# Patient Record
Sex: Male | Born: 1960
Health system: Southern US, Community
[De-identification: ages and names within clinical notes are randomized; demographics above are authoritative.]

## PROBLEM LIST (undated history)

## (undated) DIAGNOSIS — E7849 Other hyperlipidemia: Secondary | ICD-10-CM

## (undated) DIAGNOSIS — K219 Gastro-esophageal reflux disease without esophagitis: Secondary | ICD-10-CM

## (undated) DIAGNOSIS — F3170 Bipolar disorder, currently in remission, most recent episode unspecified: Secondary | ICD-10-CM

## (undated) DIAGNOSIS — F909 Attention-deficit hyperactivity disorder, unspecified type: Secondary | ICD-10-CM

## (undated) DIAGNOSIS — K589 Irritable bowel syndrome without diarrhea: Secondary | ICD-10-CM

## (undated) DIAGNOSIS — R7303 Prediabetes: Secondary | ICD-10-CM

## (undated) DIAGNOSIS — J309 Allergic rhinitis, unspecified: Secondary | ICD-10-CM

## (undated) DIAGNOSIS — I10 Essential (primary) hypertension: Secondary | ICD-10-CM

## (undated) DIAGNOSIS — K5790 Diverticulosis of intestine, part unspecified, without perforation or abscess without bleeding: Secondary | ICD-10-CM

## (undated) DIAGNOSIS — R600 Localized edema: Secondary | ICD-10-CM

## (undated) DIAGNOSIS — G4733 Obstructive sleep apnea (adult) (pediatric): Secondary | ICD-10-CM

## (undated) DIAGNOSIS — T7840XA Allergy, unspecified, initial encounter: Secondary | ICD-10-CM

## (undated) DIAGNOSIS — G4730 Sleep apnea, unspecified: Secondary | ICD-10-CM

## (undated) HISTORY — PX: APPENDECTOMY: SHX54

## (undated) HISTORY — DX: Allergy, unspecified, initial encounter: T78.40XA

## (undated) HISTORY — PX: RHINOPLASTY: SUR1284

## (undated) HISTORY — PX: COLONOSCOPY: SHX174

## (undated) HISTORY — DX: Diverticulosis of intestine, part unspecified, without perforation or abscess without bleeding: K57.90

## (undated) HISTORY — PX: NASAL TURBINATE REDUCTION: SHX2072

## (undated) HISTORY — DX: Essential (primary) hypertension: I10

## (undated) HISTORY — DX: Obstructive sleep apnea (adult) (pediatric): G47.33

## (undated) HISTORY — DX: Prediabetes: R73.03

## (undated) HISTORY — DX: Localized edema: R60.0

## (undated) HISTORY — DX: Bipolar disorder, currently in remission, most recent episode unspecified: F31.70

## (undated) HISTORY — PX: OTHER SURGICAL HISTORY: SHX169

## (undated) HISTORY — DX: Gastro-esophageal reflux disease without esophagitis: K21.9

## (undated) HISTORY — DX: Attention-deficit hyperactivity disorder, unspecified type: F90.9

## (undated) HISTORY — DX: Sleep apnea, unspecified: G47.30

## (undated) HISTORY — DX: Allergic rhinitis, unspecified: J30.9

## (undated) HISTORY — DX: Other hyperlipidemia: E78.49

## (undated) HISTORY — PX: NASAL SINUS SURGERY: SHX719

## (undated) HISTORY — DX: Irritable bowel syndrome without diarrhea: K58.9

---

## 1998-12-10 ENCOUNTER — Encounter: Payer: Self-pay | Admitting: Family Medicine

## 1998-12-10 ENCOUNTER — Ambulatory Visit (HOSPITAL_COMMUNITY): Admission: RE | Admit: 1998-12-10 | Discharge: 1998-12-10 | Payer: Self-pay | Admitting: Family Medicine

## 2011-03-10 ENCOUNTER — Other Ambulatory Visit: Payer: Self-pay | Admitting: Occupational Medicine

## 2011-03-10 ENCOUNTER — Ambulatory Visit: Payer: Self-pay

## 2011-03-10 DIAGNOSIS — R7611 Nonspecific reaction to tuberculin skin test without active tuberculosis: Secondary | ICD-10-CM

## 2013-11-18 ENCOUNTER — Encounter: Payer: Self-pay | Admitting: Gastroenterology

## 2013-12-23 ENCOUNTER — Ambulatory Visit (AMBULATORY_SURGERY_CENTER): Payer: 59 | Admitting: *Deleted

## 2013-12-23 VITALS — Ht 66.0 in | Wt 247.6 lb

## 2013-12-23 DIAGNOSIS — Z1211 Encounter for screening for malignant neoplasm of colon: Secondary | ICD-10-CM

## 2013-12-23 MED ORDER — PEG-KCL-NACL-NASULF-NA ASC-C 100 G PO SOLR: ORAL | Status: DC

## 2013-12-23 NOTE — Progress Notes (Signed)
No egg or soy allergy 

## 2013-12-29 ENCOUNTER — Encounter: Payer: Self-pay | Admitting: Gastroenterology

## 2014-01-06 ENCOUNTER — Ambulatory Visit (AMBULATORY_SURGERY_CENTER): Payer: 59 | Admitting: Gastroenterology

## 2014-01-06 ENCOUNTER — Encounter: Payer: Self-pay | Admitting: Gastroenterology

## 2014-01-06 VITALS — BP 131/94 | HR 51 | Temp 96.9°F | Resp 32 | Ht 66.0 in | Wt 247.0 lb

## 2014-01-06 DIAGNOSIS — D126 Benign neoplasm of colon, unspecified: Secondary | ICD-10-CM

## 2014-01-06 DIAGNOSIS — K5730 Diverticulosis of large intestine without perforation or abscess without bleeding: Secondary | ICD-10-CM

## 2014-01-06 DIAGNOSIS — Z1211 Encounter for screening for malignant neoplasm of colon: Secondary | ICD-10-CM

## 2014-01-06 MED ORDER — SODIUM CHLORIDE 0.9 % IV SOLN: 500.0000 mL | INTRAVENOUS | Status: DC

## 2014-01-06 NOTE — Progress Notes (Signed)
Called to room to assist during endoscopic procedure.  Patient ID and intended procedure confirmed with present staff. Received instructions for my participation in the procedure from the performing physician.  

## 2014-01-06 NOTE — Progress Notes (Signed)
Patient refused rectal tube to aide in expelling of air. Assist to bathroom to aide in expelling of air. Mo success. Stated i feel better than when i came in here. Moderate amount of air expelled while in recovery. Instructed to drink warm liquids and take gas-x or mylanta to aide in expelling of air. Patient turn from side to side while in bed. Instructed also to call if needed.

## 2014-01-06 NOTE — Op Note (Signed)
[place]  [address]. [place], [address]   COLONOSCOPY PROCEDURE REPORT  PATIENT: Brian Gonzalez, Brian Gonzalez  MR#: [id number] BIRTHDATE: [birth year] , 52  yrs. old GENDER: Male ENDOSCOPIST: Milus Banister, MD PROCEDURE DATE:  01/06/2014 PROCEDURE:   Colonoscopy with snare polypectomy First Screening Colonoscopy - Avg.  risk and is 50 yrs.  old or older Yes.  Prior Negative Screening - Now for repeat screening. N/A  History of Adenoma - Now for follow-up colonoscopy & has been > or = to 3 yrs.  N/A  Polyps Removed Today? Yes. ASA CLASS:   Class II INDICATIONS:average risk screening. MEDICATIONS: MAC sedation, administered by CRNA and propofol (Diprivan) 200mg  IV  DESCRIPTION OF PROCEDURE:   After the risks benefits and alternatives of the procedure were thoroughly explained, informed consent was obtained.  A digital rectal exam revealed no abnormalities of the rectum.   The LB CF-H180AL Loaner [id number] endoscope was introduced through the anus and advanced to the cecum, which was identified by both the appendix and ileocecal valve. No adverse events experienced.   The quality of the prep was good.  The instrument was then slowly withdrawn as the colon was fully examined.  COLON FINDINGS: Two polyps were found, removed and sent to pathology.  These were both sessile, located in transverse segment, 3-29mm across, removed with cold snare.  There were multiple diverticulum in left colon.  There was a typical appearing 1.5cm lipoma in descending segment.  The examination was otherwise normal.  Retroflexed views revealed no abnormalities. The time to cecum=3 minutes 33 seconds.  Withdrawal time=10 minutes 29 seconds. The scope was withdrawn and the procedure completed. COMPLICATIONS: There were no complications.  ENDOSCOPIC IMPRESSION: Two polyps were found, removed and sent to pathology. There were multiple diverticulum in left colon. There was a typical appearing 1.5cm  lipoma in descending segment. The examination was otherwise normal.  RECOMMENDATIONS: If the polyp(s) removed today are proven to be adenomatous (pre-cancerous) polyps, you will need a repeat colonoscopy in 5 years.  Otherwise you should continue to follow colorectal cancer screening guidelines for "routine risk" patients with colonoscopy in 10 years.  You will receive a letter within 1-2 weeks with the results of your biopsy as well as final recommendations.  Please call my office if you have not received a letter after 3 weeks.   eSigned:  Milus Banister, MD 01/06/2014 9:31 AM

## 2014-01-06 NOTE — Patient Instructions (Signed)
Discharge instructions given with verbal understanding. Handouts on polyps and diverticulosis. Resume previous medications. YOU HAD AN ENDOSCOPIC PROCEDURE TODAY AT THE [facility]: Refer to the procedure report that was given to you for any specific questions about what was found during the examination.  If the procedure report does not answer your questions, please call your gastroenterologist to clarify.  If you requested that your care partner not be given the details of your procedure findings, then the procedure report has been included in a sealed envelope for you to review at your convenience later.  YOU SHOULD EXPECT: Some feelings of bloating in the abdomen. Passage of more gas than usual.  Walking can help get rid of the air that was put into your GI tract during the procedure and reduce the bloating. If you had a lower endoscopy (such as a colonoscopy or flexible sigmoidoscopy) you may notice spotting of blood in your stool or on the toilet paper. If you underwent a bowel prep for your procedure, then you may not have a normal bowel movement for a few days.  DIET: Your first meal following the procedure should be a light meal and then it is ok to progress to your normal diet.  A half-sandwich or bowl of soup is an example of a good first meal.  Heavy or fried foods are harder to digest and may make you feel nauseous or bloated.  Likewise meals heavy in dairy and vegetables can cause extra gas to form and this can also increase the bloating.  Drink plenty of fluids but you should avoid alcoholic beverages for 24 hours.  ACTIVITY: Your care partner should take you home directly after the procedure.  You should plan to take it easy, moving slowly for the rest of the day.  You can resume normal activity the day after the procedure however you should NOT DRIVE or use heavy machinery for 24 hours (because of the sedation medicines used during the test).    SYMPTOMS TO REPORT  IMMEDIATELY: A gastroenterologist can be reached at any hour.  During normal business hours, 8:30 AM to 5:00 PM Monday through Friday, call [phone].  After hours and on weekends, please call the GI answering service at [phone] who will take a message and have the physician on call contact you.   Following lower endoscopy (colonoscopy or flexible sigmoidoscopy):  Excessive amounts of blood in the stool  Significant tenderness or worsening of abdominal pains  Swelling of the abdomen that is new, acute  Fever of 100F or higher  FOLLOW UP: If any biopsies were taken you will be contacted by phone or by letter within the next 1-3 weeks.  Call your gastroenterologist if you have not heard about the biopsies in 3 weeks.  Our staff will call the home number listed on your records the next business day following your procedure to check on you and address any questions or concerns that you may have at that time regarding the information given to you following your procedure. This is a courtesy call and so if there is no answer at the home number and we have not heard from you through the emergency physician on call, we will assume that you have returned to your regular daily activities without incident.  SIGNATURES/CONFIDENTIALITY: You and/or your care partner have signed paperwork which will be entered into your electronic medical record.  These signatures attest to the fact that that the information above on your After Visit Summary   has been reviewed and is understood.  Full responsibility of the confidentiality of this discharge information lies with you and/or your care-partner. 

## 2014-01-06 NOTE — Progress Notes (Signed)
Procedure ends, to recovery, report given and VSS. 

## 2014-01-12 ENCOUNTER — Encounter: Payer: Self-pay | Admitting: Gastroenterology

## 2014-05-04 ENCOUNTER — Telehealth: Payer: Self-pay | Admitting: Gastroenterology

## 2014-05-09 NOTE — Telephone Encounter (Signed)
Left message on machine to call back  

## 2014-05-10 NOTE — Telephone Encounter (Signed)
x2 Left message on machine to call back

## 2014-05-11 NOTE — Telephone Encounter (Signed)
I will wait for further communication from the pt

## 2014-10-08 ENCOUNTER — Encounter: Payer: Self-pay | Admitting: *Deleted

## 2015-03-21 ENCOUNTER — Other Ambulatory Visit: Payer: Self-pay | Admitting: Family Medicine

## 2015-03-21 ENCOUNTER — Ambulatory Visit
Admission: RE | Admit: 2015-03-21 | Discharge: 2015-03-21 | Disposition: A | Discharge status: Home / Self Care | Payer: 59 | Source: Ambulatory Visit | Attending: Family Medicine | Admitting: Family Medicine

## 2015-03-21 DIAGNOSIS — J40 Bronchitis, not specified as acute or chronic: Secondary | ICD-10-CM

## 2015-12-04 ENCOUNTER — Other Ambulatory Visit: Payer: Self-pay | Admitting: Family Medicine

## 2015-12-04 ENCOUNTER — Ambulatory Visit
Admission: RE | Admit: 2015-12-04 | Discharge: 2015-12-04 | Disposition: A | Discharge status: Home / Self Care | Payer: No Typology Code available for payment source | Source: Ambulatory Visit | Attending: Family Medicine | Admitting: Family Medicine

## 2015-12-04 DIAGNOSIS — M25571 Pain in right ankle and joints of right foot: Secondary | ICD-10-CM

## 2017-02-17 ENCOUNTER — Ambulatory Visit
Admission: RE | Admit: 2017-02-17 | Discharge: 2017-02-17 | Disposition: A | Discharge status: Home / Self Care | Payer: Managed Care, Other (non HMO) | Source: Ambulatory Visit | Attending: Physician Assistant | Admitting: Physician Assistant

## 2017-02-17 ENCOUNTER — Other Ambulatory Visit: Payer: Self-pay | Admitting: Physician Assistant

## 2017-02-17 DIAGNOSIS — R0602 Shortness of breath: Secondary | ICD-10-CM

## 2017-05-08 ENCOUNTER — Ambulatory Visit
Admission: RE | Admit: 2017-05-08 | Discharge: 2017-05-08 | Disposition: A | Discharge status: Home / Self Care | Payer: Managed Care, Other (non HMO) | Source: Ambulatory Visit | Attending: Family Medicine | Admitting: Family Medicine

## 2017-05-08 ENCOUNTER — Other Ambulatory Visit: Payer: Self-pay | Admitting: Family Medicine

## 2017-05-08 DIAGNOSIS — R1032 Left lower quadrant pain: Secondary | ICD-10-CM

## 2017-05-08 MED ORDER — IOPAMIDOL (ISOVUE-300) INJECTION 61%
125.0000 mL | Freq: Once | INTRAVENOUS | Status: AC | PRN
  Administered 2017-05-08: 125 mL via INTRAVENOUS

## 2017-08-20 DIAGNOSIS — J3089 Other allergic rhinitis: Secondary | ICD-10-CM | POA: Diagnosis not present

## 2017-08-20 DIAGNOSIS — R05 Cough: Secondary | ICD-10-CM | POA: Diagnosis not present

## 2017-08-20 DIAGNOSIS — G4730 Sleep apnea, unspecified: Secondary | ICD-10-CM | POA: Diagnosis not present

## 2017-08-20 DIAGNOSIS — J42 Unspecified chronic bronchitis: Secondary | ICD-10-CM | POA: Diagnosis not present

## 2017-08-20 DIAGNOSIS — J329 Chronic sinusitis, unspecified: Secondary | ICD-10-CM | POA: Diagnosis not present

## 2017-12-22 DIAGNOSIS — L918 Other hypertrophic disorders of the skin: Secondary | ICD-10-CM | POA: Diagnosis not present

## 2017-12-22 DIAGNOSIS — L989 Disorder of the skin and subcutaneous tissue, unspecified: Secondary | ICD-10-CM | POA: Diagnosis not present

## 2017-12-29 DIAGNOSIS — J069 Acute upper respiratory infection, unspecified: Secondary | ICD-10-CM | POA: Diagnosis not present

## 2017-12-30 DIAGNOSIS — D485 Neoplasm of uncertain behavior of skin: Secondary | ICD-10-CM | POA: Diagnosis not present

## 2017-12-30 DIAGNOSIS — A630 Anogenital (venereal) warts: Secondary | ICD-10-CM | POA: Diagnosis not present

## 2017-12-30 DIAGNOSIS — L918 Other hypertrophic disorders of the skin: Secondary | ICD-10-CM | POA: Diagnosis not present

## 2017-12-30 DIAGNOSIS — L910 Hypertrophic scar: Secondary | ICD-10-CM | POA: Diagnosis not present

## 2017-12-30 MED FILL — CORDRAN 4 MCG/SQ CM TAPE LA: 4 | 30 days supply | Qty: 1 | Fill #0

## 2017-12-30 MED FILL — IMIQUIMOD 5 % CREA: 5 | 56 days supply | Qty: 24 | Fill #0

## 2018-01-01 MED FILL — AZITHROMYCIN 250 MG TABS: 250 | 5 days supply | Qty: 6 | Fill #0

## 2018-01-11 DIAGNOSIS — J018 Other acute sinusitis: Secondary | ICD-10-CM | POA: Diagnosis not present

## 2018-02-11 DIAGNOSIS — Z0000 Encounter for general adult medical examination without abnormal findings: Secondary | ICD-10-CM | POA: Diagnosis not present

## 2018-02-11 DIAGNOSIS — H04123 Dry eye syndrome of bilateral lacrimal glands: Secondary | ICD-10-CM | POA: Diagnosis not present

## 2018-02-11 DIAGNOSIS — E781 Pure hyperglyceridemia: Secondary | ICD-10-CM | POA: Diagnosis not present

## 2018-02-11 DIAGNOSIS — J310 Chronic rhinitis: Secondary | ICD-10-CM | POA: Diagnosis not present

## 2018-02-11 DIAGNOSIS — L989 Disorder of the skin and subcutaneous tissue, unspecified: Secondary | ICD-10-CM | POA: Diagnosis not present

## 2018-02-16 DIAGNOSIS — Z0000 Encounter for general adult medical examination without abnormal findings: Secondary | ICD-10-CM | POA: Diagnosis not present

## 2018-02-16 DIAGNOSIS — E781 Pure hyperglyceridemia: Secondary | ICD-10-CM | POA: Diagnosis not present

## 2018-04-15 ENCOUNTER — Encounter: Payer: Self-pay | Admitting: Family

## 2018-04-15 ENCOUNTER — Ambulatory Visit (INDEPENDENT_AMBULATORY_CARE_PROVIDER_SITE_OTHER): Payer: Self-pay | Admitting: Family

## 2018-04-15 VITALS — BP 138/92 | HR 97 | Temp 98.4°F | Resp 17 | Ht 66.0 in | Wt 265.2 lb

## 2018-04-15 DIAGNOSIS — E669 Obesity, unspecified: Secondary | ICD-10-CM

## 2018-04-15 DIAGNOSIS — I10 Essential (primary) hypertension: Secondary | ICD-10-CM

## 2018-04-15 DIAGNOSIS — Z0000 Encounter for general adult medical examination without abnormal findings: Secondary | ICD-10-CM

## 2018-04-15 NOTE — Progress Notes (Signed)
Subjective:     Patient ID: Brian Gonzalez, male   DOB: 12-Sep-1961, 57 y.o.   MRN: [id number]  HPI 57 year old hispanic male is in today for wellness exam. He has a history of Diverticulosis, Colon polyps, Hypertension-diet controlled. He has a PCP who does routine labs. Reports being due for an updated colonoscopy. Works for [employer].   Review of Systems  Gastrointestinal: Negative for anal bleeding and blood in stool.  All other systems reviewed and are negative.  Past Medical History:  Diagnosis Date  . GERD (gastroesophageal reflux disease)   . Sleep apnea    no CPAP used    Social History   Socioeconomic History  . Marital status: Legally Separated    Spouse name: Not on file  . Number of children: Not on file  . Years of education: Not on file  . Highest education level: Not on file  Occupational History  . Not on file  Social Needs  . Financial resource strain: Not on file  . Food insecurity:    Worry: Not on file    Inability: Not on file  . Transportation needs:    Medical: Not on file    Non-medical: Not on file  Tobacco Use  . Smoking status: Former Smoker    Last attempt to quit: 07/23/2012    Years since quitting: 5.7  . Smokeless tobacco: Never Used  Substance and Sexual Activity  . Alcohol use: Yes    Comment: occasionally  . Drug use: No  . Sexual activity: Not on file  Lifestyle  . Physical activity:    Days per week: Not on file    Minutes per session: Not on file  . Stress: Not on file  Relationships  . Social connections:    Talks on phone: Not on file    Gets together: Not on file    Attends religious service: Not on file    Active member of club or organization: Not on file    Attends meetings of clubs or organizations: Not on file    Relationship status: Not on file  . Intimate partner violence:    Fear of current or ex partner: Not on file    Emotionally abused: Not on file    Physically abused: Not on file    Forced sexual activity: Not  on file  Other Topics Concern  . Not on file  Social History Narrative  . Not on file    Past Surgical History:  Procedure Laterality Date  . APPENDECTOMY    . NASAL SINUS SURGERY     x3  . RHINOPLASTY    . right knee surgery    . trip turbinettes     error by writer-KW, pt had sinus surgery    Family History  Problem Relation Age of Onset  . Colon cancer Neg Hx   . Esophageal cancer Neg Hx   . Rectal cancer Neg Hx   . Stomach cancer Neg Hx     No Known Allergies  Current Outpatient Medications on File Prior to Visit  Medication Sig Dispense Refill  . Cholecalciferol (VITAMIN D PO) Take by mouth daily.     No current facility-administered medications on file prior to visit.     BP (!) 138/92 (BP Location: Right Arm, Patient Position: Sitting, Cuff Size: Large)   Pulse 97   Temp 98.4 F (36.9 C) (Oral)   Resp 17   Ht 5\' 6"  (1.676 m)   Wt 265  lb 3.2 oz (120.3 kg)   SpO2 97%   BMI 42.80 kg/m chart    Objective:   Physical Exam  Constitutional: He is oriented to person, place, and time. He appears well-developed and well-nourished.  HENT:  Head: Normocephalic and atraumatic.  Right Ear: External ear normal.  Left Ear: External ear normal.  Nose: Nose normal.  Mouth/Throat: Oropharynx is clear and moist.  Eyes: Pupils are equal, round, and reactive to light. Conjunctivae and EOM are normal.  Neck: Normal range of motion. Neck supple.  Cardiovascular: Normal rate, regular rhythm, normal heart sounds and intact distal pulses.  Pulmonary/Chest: Effort normal and breath sounds normal.  Abdominal: Soft. Bowel sounds are normal.  Musculoskeletal: Normal range of motion.  Neurological: He is alert and oriented to person, place, and time.  Skin: Skin is warm and dry.  Psychiatric: He has a normal mood and affect.       Assessment:     Azul was seen today for cpe.  Diagnoses and all orders for this visit:  Routine adult health maintenance  Hypertension,  unspecified type  Obesity determined by physical examination      Plan:     Advised patient to see PCP for referral to GI for colonoscopy screening. Low sodium Diet. Exercise to reduce his weight. Routine blood pressure checks. Labs deferred to PCP.

## 2018-04-15 NOTE — Patient Instructions (Signed)
See PCP as scheduled Exercise to reduce your weight See GI for colonoscopy screening  Exercising to Lose Weight Exercising can help you to lose weight. In order to lose weight through exercise, you need to do vigorous-intensity exercise. You can tell that you are exercising with vigorous intensity if you are breathing very hard and fast and cannot hold a conversation while exercising. Moderate-intensity exercise helps to maintain your current weight. You can tell that you are exercising at a moderate level if you have a higher heart rate and faster breathing, but you are still able to hold a conversation. How often should I exercise? Choose an activity that you enjoy and set realistic goals. Your health care provider can help you to make an activity plan that works for you. Exercise regularly as directed by your health care provider. This may include:  Doing resistance training twice each week, such as: ? Push-ups. ? Sit-ups. ? Lifting weights. ? Using resistance bands.  Doing a given intensity of exercise for a given amount of time. Choose from these options: ? 150 minutes of moderate-intensity exercise every week. ? 75 minutes of vigorous-intensity exercise every week. ? A mix of moderate-intensity and vigorous-intensity exercise every week.  Children, pregnant women, people who are out of shape, people who are overweight, and older adults may need to consult a health care provider for individual recommendations. If you have any sort of medical condition, be sure to consult your health care provider before starting a new exercise program. What are some activities that can help me to lose weight?  Walking at a rate of at least 4.5 miles an hour.  Jogging or running at a rate of 5 miles per hour.  Biking at a rate of at least 10 miles per hour.  Lap swimming.  Roller-skating or in-line skating.  Cross-country skiing.  Vigorous competitive sports, such as football, basketball, and  soccer.  Jumping rope.  Aerobic dancing. How can I be more active in my day-to-day activities?  Use the stairs instead of the elevator.  Take a walk during your lunch break.  If you drive, park your car farther away from work or school.  If you take public transportation, get off one stop early and walk the rest of the way.  Make all of your phone calls while standing up and walking around.  Get up, stretch, and walk around every 30 minutes throughout the day. What guidelines should I follow while exercising?  Do not exercise so much that you hurt yourself, feel dizzy, or get very short of breath.  Consult your health care provider prior to starting a new exercise program.  Wear comfortable clothes and shoes with good support.  Drink plenty of water while you exercise to prevent dehydration or heat stroke. Body water is lost during exercise and must be replaced.  Work out until you breathe faster and your heart beats faster. This information is not intended to replace advice given to you by your health care provider. Make sure you discuss any questions you have with your health care provider. Document Released: 11/22/2010 Document Revised: 03/27/2016 Document Reviewed: 03/23/2014 Elsevier Interactive Patient Education  2018 [employer]-healthy meal planning includes:  Limiting unhealthy fats.  Increasing healthy fats.  Making other small dietary changes.  You may need to talk with your doctor or a diet specialist (dietitian) to create an eating plan that is right for you. What types of fat should I choose?  Choose healthy fats. These include olive oil and canola oil, flaxseeds, walnuts, almonds, and seeds.  Eat more omega-3 fats. These include salmon, mackerel, sardines, tuna, flaxseed oil, and ground flaxseeds. Try to eat fish at least twice each week.  Limit saturated fats. ? Saturated fats are often found in animal products,  such as meats, butter, and cream. ? Plant sources of saturated fats include palm oil, palm kernel oil, and coconut oil.  Avoid foods with partially hydrogenated oils in them. These include stick margarine, some tub margarines, cookies, crackers, and other baked goods. These contain trans fats. What general guidelines do I need to follow?  Check food labels carefully. Identify foods with trans fats or high amounts of saturated fat.  Fill one half of your plate with vegetables and green salads. Eat 4-5 servings of vegetables per day. A serving of vegetables is: ? 1 cup of raw leafy vegetables. ?  cup of raw or cooked cut-up vegetables. ?  cup of vegetable juice.  Fill one fourth of your plate with whole grains. Look for the word "whole" as the first word in the ingredient list.  Fill one fourth of your plate with lean protein foods.  Eat 4-5 servings of fruit per day. A serving of fruit is: ? One medium whole fruit. ?  cup of dried fruit. ?  cup of fresh, frozen, or canned fruit. ?  cup of 100% fruit juice.  Eat more foods that contain soluble fiber. These include apples, broccoli, carrots, beans, peas, and barley. Try to get 20-30 g of fiber per day.  Eat more home-cooked food. Eat less restaurant, buffet, and fast food.  Limit or avoid alcohol.  Limit foods high in starch and sugar.  Avoid fried foods.  Avoid frying your food. Try baking, boiling, grilling, or broiling it instead. You can also reduce fat by: ? Removing the skin from poultry. ? Removing all visible fats from meats. ? Skimming the fat off of stews, soups, and gravies before serving them. ? Steaming vegetables in water or broth.  Lose weight if you are overweight.  Eat 4-5 servings of nuts, legumes, and seeds per week: ? One serving of dried beans or legumes equals  cup after being cooked. ? One serving of nuts equals 1 ounces. ? One serving of seeds equals  ounce or one tablespoon.  You may need to  keep track of how much salt or sodium you eat. This is especially true if you have high blood pressure. Talk with your doctor or dietitian to get more information. What foods can I eat? Grains Breads, including [place], white, pita, wheat, raisin, rye, oatmeal, and [place]. Tortillas that are neither fried nor made with lard or trans fat. Low-fat rolls, including hotdog and hamburger buns and English muffins. Biscuits. Muffins. Waffles. Pancakes. Light popcorn. Whole-grain cereals. Flatbread. Melba toast. Pretzels. Breadsticks. Rusks. Low-fat snacks. Low-fat crackers, including oyster, saltine, matzo, graham, animal, and rye. Rice and pasta, including brown rice and pastas that are made with whole wheat. Vegetables All vegetables. Fruits All fruits, but limit coconut. Meats and Other Protein Sources Lean, well-trimmed beef, veal, pork, and lamb. Chicken and [place] without skin. All fish and shellfish. Wild duck, rabbit, pheasant, and venison. Egg whites or low-cholesterol egg substitutes. Dried beans, peas, lentils, and tofu. Seeds and most nuts. Dairy Low-fat or nonfat cheeses, including ricotta, string, and mozzarella. Skim or 1% milk that is liquid, powdered, or evaporated. Buttermilk that is made with low-fat milk. Nonfat or low-fat  yogurt. Beverages Mineral water. Diet carbonated beverages. Sweets and Desserts Sherbets and fruit ices. Honey, jam, marmalade, jelly, and syrups. Meringues and gelatins. Pure sugar candy, such as hard candy, jelly beans, gumdrops, mints, marshmallows, and small amounts of dark chocolate. [employer] Eat all sweets and desserts in moderation. Fats and Oils Nonhydrogenated (trans-free) margarines. Vegetable oils, including soybean, sesame, sunflower, olive, peanut, safflower, corn, canola, and cottonseed. Salad dressings or mayonnaise made with a vegetable oil. Limit added fats and oils that you use for cooking, baking, salads, and as spreads. Other Cocoa  powder. Coffee and tea. All seasonings and condiments. The items listed above may not be a complete list of recommended foods or beverages. Contact your dietitian for more options. What foods are not recommended? Grains Breads that are made with saturated or trans fats, oils, or whole milk. Croissants. Butter rolls. Cheese breads. Sweet rolls. Donuts. Buttered popcorn. Chow mein noodles. High-fat crackers, such as cheese or butter crackers. Meats and Other Protein Sources Fatty meats, such as hotdogs, short ribs, sausage, spareribs, bacon, rib eye roast or steak, and mutton. High-fat deli meats, such as salami and bologna. Caviar. Domestic duck and goose. Organ meats, such as kidney, liver, sweetbreads, and heart. Dairy Cream, sour cream, cream cheese, and creamed cottage cheese. Whole-milk cheeses, including blue (bleu), Monterey Jack, [place], [place], American, [place], Swiss, cheddar, [employer], and [place]. Whole or 2% milk that is liquid, evaporated, or condensed. Whole buttermilk. Cream sauce or high-fat cheese sauce. Yogurt that is made from whole milk. Beverages Regular sodas and juice drinks with added sugar. Sweets and Desserts Frosting. Pudding. Cookies. Cakes other than angel food cake. Candy that has milk chocolate or white chocolate, hydrogenated fat, butter, coconut, or unknown ingredients. Buttered syrups. Full-fat ice cream or ice cream drinks. Fats and Oils Gravy that has suet, meat fat, or shortening. Cocoa butter, hydrogenated oils, palm oil, coconut oil, palm kernel oil. These can often be found in baked products, candy, fried foods, nondairy creamers, and whipped toppings. Solid fats and shortenings, including bacon fat, salt pork, lard, and butter. Nondairy cream substitutes, such as coffee creamers and sour cream substitutes. Salad dressings that are made of unknown oils, cheese, or sour cream. The items listed above may not be a complete list of foods and beverages to avoid.  Contact your dietitian for more information. This information is not intended to replace advice given to you by your health care provider. Make sure you discuss any questions you have with your health care provider. Document Released: 04/20/2012 Document Revised: 03/27/2016 Document Reviewed: 04/13/2014 Elsevier Interactive Patient Education  [employer].

## 2018-08-23 DIAGNOSIS — D213 Benign neoplasm of connective and other soft tissue of thorax: Secondary | ICD-10-CM | POA: Diagnosis not present

## 2018-08-23 MED FILL — KETOCONAZOLE 2% CREAM: 2 | 30 days supply | Qty: 30 | Fill #0

## 2018-08-23 MED FILL — HYDROCORTISONE 2.5% CREAM: 2.5 | 30 days supply | Qty: 30 | Fill #0

## 2018-09-09 DIAGNOSIS — L989 Disorder of the skin and subcutaneous tissue, unspecified: Secondary | ICD-10-CM | POA: Diagnosis not present

## 2018-09-24 ENCOUNTER — Ambulatory Visit: Payer: No Typology Code available for payment source | Admitting: Plastic Surgery

## 2018-09-24 ENCOUNTER — Encounter: Payer: Self-pay | Admitting: Plastic Surgery

## 2018-09-24 DIAGNOSIS — N6310 Unspecified lump in the right breast, unspecified quadrant: Secondary | ICD-10-CM

## 2018-09-24 DIAGNOSIS — D219 Benign neoplasm of connective and other soft tissue, unspecified: Secondary | ICD-10-CM | POA: Insufficient documentation

## 2018-09-24 NOTE — Progress Notes (Signed)
     Patient ID: Brian Gonzalez, male    DOB: 08-24-1961, 57 y.o.   MRN: [id number]   Chief Complaint  Patient presents with  . Skin Problem    The patient is a 57 yrs old male here for evaluation of a mass on the right breast.  He states it has been present since he was a teenager.  He states that now it is getting tender.  He had a biopsy at Dr. Juanna Cao office and we are requesting the results.  It is at the 12 o'clock position of the areola of the right breast it is 2 cm in size the skin over top is slightly scabbed.  It does not seem to be tender to palpation.  No other masses in either breast have been noticed or palpated.  He denies a family history of breast cancer.  He has a history of diverticulitis and bipolar disorder.   Review of Systems  Constitutional: Negative.   HENT: Negative.   Eyes: Negative.   Respiratory: Negative.   Cardiovascular: Negative.   Gastrointestinal: Negative.   Endocrine: Negative.   Genitourinary: Negative.   Musculoskeletal: Negative.   Skin: Negative.   Neurological: Negative.     Past Medical History:  Diagnosis Date  . GERD (gastroesophageal reflux disease)   . Sleep apnea    no CPAP used    Past Surgical History:  Procedure Laterality Date  . APPENDECTOMY    . NASAL SINUS SURGERY     x3  . RHINOPLASTY    . right knee surgery    . trip turbinettes     error by writer-KW, pt had sinus surgery      Current Outpatient Medications:  .  Cholecalciferol (VITAMIN D PO), Take by mouth daily., Disp: , Rfl:    Objective:   Vitals:   09/24/18 0937  BP: (!) 148/90  Pulse: 96  Resp: 16  SpO2: 97%    Physical Exam  Constitutional: He appears well-developed and well-nourished.  HENT:  Head: Normocephalic and atraumatic.  Eyes: Pupils are equal, round, and reactive to light.  Cardiovascular: Normal rate.  Pulmonary/Chest: Effort normal.    Abdominal: Soft. He exhibits no distension.  Neurological: He is alert.  Skin: Skin is  warm.  Psychiatric: He has a normal mood and affect. His behavior is normal. Judgment and thought content normal.    Assessment & Plan:  Mass of right breast Recommend excision of the right breast lesion with full pathology evaluation.   [place], DO

## 2018-10-15 DIAGNOSIS — J189 Pneumonia, unspecified organism: Secondary | ICD-10-CM | POA: Diagnosis not present

## 2018-10-15 DIAGNOSIS — J310 Chronic rhinitis: Secondary | ICD-10-CM | POA: Diagnosis not present

## 2018-10-15 MED FILL — levoFLOXacin 500 MG TABS: 500 | 10 days supply | Qty: 10 | Fill #0

## 2018-10-15 MED FILL — FLUTICASONE PROP 50 MCG SPR: 50 | 30 days supply | Qty: 16 | Fill #0

## 2018-11-17 DIAGNOSIS — F902 Attention-deficit hyperactivity disorder, combined type: Secondary | ICD-10-CM | POA: Diagnosis not present

## 2018-11-17 DIAGNOSIS — G4733 Obstructive sleep apnea (adult) (pediatric): Secondary | ICD-10-CM | POA: Diagnosis not present

## 2018-11-17 DIAGNOSIS — J4521 Mild intermittent asthma with (acute) exacerbation: Secondary | ICD-10-CM | POA: Diagnosis not present

## 2018-11-17 MED FILL — AMOXICILLIN 875 MG TABS: 875 | 14 days supply | Qty: 28 | Fill #0

## 2018-11-17 MED FILL — AMPHETAMINE-DEXTROAMPHET ER: 20 | 30 days supply | Qty: 30 | Fill #0

## 2018-11-17 MED FILL — predniSONE 10 MG TABS: 10 | 12 days supply | Qty: 30 | Fill #0

## 2018-12-15 MED FILL — KETOCONAZOLE 2% CREAM: 2 | 20 days supply | Qty: 30 | Fill #0

## 2018-12-15 MED FILL — HYDROCORTISONE 2.5% CREAM: 2.5 | 20 days supply | Qty: 30 | Fill #0

## 2019-01-06 ENCOUNTER — Encounter: Payer: Self-pay | Admitting: Plastic Surgery

## 2019-01-06 ENCOUNTER — Other Ambulatory Visit (HOSPITAL_COMMUNITY)
Admission: RE | Admit: 2019-01-06 | Discharge: 2019-01-06 | Disposition: A | Discharge status: Home / Self Care | Payer: No Typology Code available for payment source | Source: Ambulatory Visit | Attending: Plastic Surgery | Admitting: Plastic Surgery

## 2019-01-06 ENCOUNTER — Ambulatory Visit (INDEPENDENT_AMBULATORY_CARE_PROVIDER_SITE_OTHER): Payer: No Typology Code available for payment source | Admitting: Plastic Surgery

## 2019-01-06 VITALS — BP 154/88 | HR 75 | Temp 98.2°F | Ht 66.0 in | Wt 272.0 lb

## 2019-01-06 DIAGNOSIS — N6310 Unspecified lump in the right breast, unspecified quadrant: Secondary | ICD-10-CM | POA: Diagnosis not present

## 2019-01-07 ENCOUNTER — Encounter: Payer: Self-pay | Admitting: Plastic Surgery

## 2019-01-07 NOTE — Progress Notes (Signed)
Preoperative Dx: mass of right breast  Postoperative Dx: Same  Procedure: excision of mass of right breast 2 cm  Anesthesia: Lidocaine 1% with 1:100,000 epinepherine  Description of Procedure: Risks and complications were explained to the patient.  Consent was confirmed.  Time out was called and all information was confirmed to be correct.  The area was prepped with betadine and drapped.  Lidocaine 1% with epinepherine was injected in the subcutaneous area.  After waiting several minutes for the lidocaine to take affect a #15 blade was used to excise the area in an eliptical pattern with 18mm borders from the biopsy site.  A 5-0 Monocryl was used to close the deep layers with simple interrupted stitches.  The skin edges were reapproximated with 5-0 Monocryl subcuticular running closure.  Dermabond was applied.  The patient is to follow up in one week.  He tolerated the procedure well and there were no complications. The specimen was sent to pathology.

## 2019-01-17 ENCOUNTER — Encounter: Payer: No Typology Code available for payment source | Admitting: Plastic Surgery

## 2019-01-17 DIAGNOSIS — G4733 Obstructive sleep apnea (adult) (pediatric): Secondary | ICD-10-CM | POA: Diagnosis not present

## 2019-01-18 ENCOUNTER — Encounter: Payer: No Typology Code available for payment source | Admitting: Plastic Surgery

## 2019-01-18 ENCOUNTER — Other Ambulatory Visit: Payer: Self-pay

## 2019-01-18 ENCOUNTER — Ambulatory Visit (INDEPENDENT_AMBULATORY_CARE_PROVIDER_SITE_OTHER): Payer: No Typology Code available for payment source | Admitting: Plastic Surgery

## 2019-01-18 ENCOUNTER — Encounter: Payer: Self-pay | Admitting: Plastic Surgery

## 2019-01-18 VITALS — BP 148/84 | HR 60 | Temp 98.1°F | Ht 66.0 in | Wt 270.0 lb

## 2019-01-18 DIAGNOSIS — D219 Benign neoplasm of connective and other soft tissue, unspecified: Secondary | ICD-10-CM

## 2019-01-18 NOTE — Progress Notes (Signed)
The patient is a 58 year old male here for follow-up after undergoing excision of a mass on the right breast.  The path showed a leiomyoma.  The incision is healing well.  No sign of infection or seroma.  Sutures removed today.  Follow-up as needed.

## 2019-01-20 ENCOUNTER — Encounter: Payer: Self-pay | Admitting: Gastroenterology

## 2019-02-01 DIAGNOSIS — R040 Epistaxis: Secondary | ICD-10-CM | POA: Diagnosis not present

## 2019-03-14 MED FILL — HYDROCORTISONE 2.5% CREAM: 2.5 | 30 days supply | Qty: 30 | Fill #0

## 2019-03-14 MED FILL — KETOCONAZOLE 2% CREAM: 2 | 30 days supply | Qty: 30 | Fill #0

## 2019-04-26 DIAGNOSIS — G4733 Obstructive sleep apnea (adult) (pediatric): Secondary | ICD-10-CM | POA: Diagnosis not present

## 2019-05-26 DIAGNOSIS — G4733 Obstructive sleep apnea (adult) (pediatric): Secondary | ICD-10-CM | POA: Diagnosis not present

## 2019-06-03 MED FILL — KETOCONAZOLE 2% CREAM: 2 | 30 days supply | Qty: 30 | Fill #0

## 2019-06-03 MED FILL — HYDROCORTISONE 2.5% CREAM: 2.5 | 15 days supply | Qty: 30 | Fill #0

## 2019-06-26 DIAGNOSIS — G4733 Obstructive sleep apnea (adult) (pediatric): Secondary | ICD-10-CM | POA: Diagnosis not present

## 2019-07-01 ENCOUNTER — Encounter: Payer: Self-pay | Admitting: Gastroenterology

## 2019-07-14 ENCOUNTER — Other Ambulatory Visit: Payer: Self-pay

## 2019-07-14 ENCOUNTER — Ambulatory Visit (AMBULATORY_SURGERY_CENTER): Payer: Self-pay

## 2019-07-14 VITALS — Ht 66.0 in | Wt 284.0 lb

## 2019-07-14 DIAGNOSIS — Z86010 Personal history of colonic polyps: Secondary | ICD-10-CM

## 2019-07-14 MED ORDER — PEG 3350-KCL-NA BICARB-NACL 420 G PO SOLR: 4000.0000 mL | Freq: Once | ORAL | 0 refills | Status: AC

## 2019-07-14 MED FILL — PEG-3350 SOLUTION: 420 | 2 days supply | Qty: 4000 | Fill #0

## 2019-07-14 NOTE — Progress Notes (Signed)
Denies allergies to eggs or soy products. Denies complication of anesthesia or sedation. Denies use of weight loss medication. Denies use of O2.   Emmi instructions given for colonoscopy.  

## 2019-07-20 ENCOUNTER — Encounter: Payer: Self-pay | Admitting: Gastroenterology

## 2019-07-27 DIAGNOSIS — G4733 Obstructive sleep apnea (adult) (pediatric): Secondary | ICD-10-CM | POA: Diagnosis not present

## 2019-07-28 ENCOUNTER — Telehealth: Payer: Self-pay

## 2019-07-28 NOTE — Telephone Encounter (Signed)
Covid-19 screening questions   Do you now or have you had a fever in the last 14 days?  Do you have any respiratory symptoms of shortness of breath or cough now or in the last 14 days?  Do you have any family members or close contacts with diagnosed or suspected Covid-19 in the past 14 days?  Have you been tested for Covid-19 and found to be positive?       

## 2019-07-29 ENCOUNTER — Encounter: Payer: Self-pay | Admitting: Gastroenterology

## 2019-07-29 ENCOUNTER — Other Ambulatory Visit: Payer: Self-pay

## 2019-07-29 ENCOUNTER — Ambulatory Visit (AMBULATORY_SURGERY_CENTER): Payer: BC Managed Care – PPO | Admitting: Gastroenterology

## 2019-07-29 VITALS — BP 118/78 | HR 75 | Temp 98.1°F | Resp 18 | Ht 66.0 in | Wt 284.0 lb

## 2019-07-29 DIAGNOSIS — D124 Benign neoplasm of descending colon: Secondary | ICD-10-CM | POA: Diagnosis not present

## 2019-07-29 DIAGNOSIS — Z86010 Personal history of colonic polyps: Secondary | ICD-10-CM

## 2019-07-29 DIAGNOSIS — D122 Benign neoplasm of ascending colon: Secondary | ICD-10-CM

## 2019-07-29 DIAGNOSIS — D123 Benign neoplasm of transverse colon: Secondary | ICD-10-CM | POA: Diagnosis not present

## 2019-07-29 DIAGNOSIS — Z1211 Encounter for screening for malignant neoplasm of colon: Secondary | ICD-10-CM | POA: Diagnosis not present

## 2019-07-29 MED ORDER — SODIUM CHLORIDE 0.9 % IV SOLN: 500.0000 mL | Freq: Once | INTRAVENOUS | Status: AC

## 2019-07-29 NOTE — Patient Instructions (Signed)
YOU HAD AN ENDOSCOPIC PROCEDURE TODAY AT THE [facility]:   Refer to the procedure report that was given to you for any specific questions about what was found during the examination.  If the procedure report does not answer your questions, please call your gastroenterologist to clarify.  If you requested that your care partner not be given the details of your procedure findings, then the procedure report has been included in a sealed envelope for you to review at your convenience later.  YOU SHOULD EXPECT: Some feelings of bloating in the abdomen. Passage of more gas than usual.  Walking can help get rid of the air that was put into your GI tract during the procedure and reduce the bloating. If you had a lower endoscopy (such as a colonoscopy or flexible sigmoidoscopy) you may notice spotting of blood in your stool or on the toilet paper. If you underwent a bowel prep for your procedure, you may not have a normal bowel movement for a few days.  Please Note:  You might notice some irritation and congestion in your nose or some drainage.  This is from the oxygen used during your procedure.  There is no need for concern and it should clear up in a day or so.  SYMPTOMS TO REPORT IMMEDIATELY:   Following lower endoscopy (colonoscopy or flexible sigmoidoscopy):  Excessive amounts of blood in the stool  Significant tenderness or worsening of abdominal pains  Swelling of the abdomen that is new, acute  Fever of 100F or higher   For urgent or emergent issues, a gastroenterologist can be reached at any hour by calling [phone].   DIET:  We do recommend a small meal at first, but then you may proceed to your regular diet.  Drink plenty of fluids but you should avoid alcoholic beverages for 24 hours.  MEDICATIONS: Continue present medications.  Please see handouts given to you by your recovery nurse.  ACTIVITY:  You should plan to take it easy for the rest of today and you should  NOT DRIVE or use heavy machinery until tomorrow (because of the sedation medicines used during the test).    FOLLOW UP: Our staff will call the number listed on your records 48-72 hours following your procedure to check on you and address any questions or concerns that you may have regarding the information given to you following your procedure. If we do not reach you, we will leave a message.  We will attempt to reach you two times.  During this call, we will ask if you have developed any symptoms of COVID 19. If you develop any symptoms (ie: fever, flu-like symptoms, shortness of breath, cough etc.) before then, please call [phone].  If you test positive for Covid 19 in the 2 weeks post procedure, please call and report this information to us.    If any biopsies were taken you will be contacted by phone or by letter within the next 1-3 weeks.  Please call us at [phone] if you have not heard about the biopsies in 3 weeks.   Thank you for allowing us to provide for your healthcare needs today.   SIGNATURES/CONFIDENTIALITY: You and/or your care partner have signed paperwork which will be entered into your electronic medical record.  These signatures attest to the fact that that the information above on your After Visit Summary has been reviewed and is understood.  Full responsibility of the confidentiality of this discharge information lies with you and/or   your care-partner. 

## 2019-07-29 NOTE — Op Note (Signed)
[facility] Patient Name: Jasias Hardiman Procedure Date: 07/29/2019 10:55 AM MRN: [id number] Endoscopist: Milus Banister , MD Age: 58 Referring MD:  Date of Birth: 10/26/1961 Gender: Male Account #: [id number] Procedure:                Colonoscopy Indications:              High risk colon cancer surveillance: Personal                            history of colonic polyps; colonoscopy 2015 two                            subCM adenomas Medicines:                Monitored Anesthesia Care Procedure:                Pre-Anesthesia Assessment:                           - Prior to the procedure, a History and Physical                            was performed, and patient medications and                            allergies were reviewed. The patient's tolerance of                            previous anesthesia was also reviewed. The risks                            and benefits of the procedure and the sedation                            options and risks were discussed with the patient.                            All questions were answered, and informed consent                            was obtained. Prior Anticoagulants: The patient has                            taken no previous anticoagulant or antiplatelet                            agents. ASA Grade Assessment: II - A patient with                            mild systemic disease. After reviewing the risks                            and benefits, the patient was deemed in  satisfactory condition to undergo the procedure.                           After obtaining informed consent, the colonoscope                            was passed under direct vision. Throughout the                            procedure, the patient's blood pressure, pulse, and                            oxygen saturations were monitored continuously. The                            Colonoscope was introduced through the anus and                         advanced to the the cecum, identified by                            appendiceal orifice and ileocecal valve. The                            colonoscopy was performed without difficulty. The                            patient tolerated the procedure well. The quality                            of the bowel preparation was adequate. The                            ileocecal valve, appendiceal orifice, and rectum                            were photographed. Scope In: 10:59:41 AM Scope Out: 11:13:59 AM Scope Withdrawal Time: 0 hours 10 minutes 17 seconds  Total Procedure Duration: 0 hours 14 minutes 18 seconds  Findings:                 Three sessile polyps were found in the transverse                            colon and ascending colon. The polyps were 4 to 8                            mm in size. These polyps were removed with a cold                            snare. Resection and retrieval were complete.                           Multiple small and large-mouthed diverticula were  found in the entire colon.                           The exam was otherwise without abnormality on                            direct and retroflexion views. Complications:            No immediate complications. Estimated blood loss:                            None. Estimated Blood Loss:     Estimated blood loss: none. Impression:               - Three 4 to 8 mm polyps in the transverse colon                            and in the ascending colon, removed with a cold                            snare. Resected and retrieved.                           - Diverticulosis in the entire examined colon.                           - The examination was otherwise normal on direct                            and retroflexion views. Recommendation:           - Patient has a contact number available for                            emergencies. The signs and symptoms of potential                             delayed complications were discussed with the                            patient. Return to normal activities tomorrow.                            Written discharge instructions were provided to the                            patient.                           - Resume previous diet.                           - Continue present medications.                           - Await pathology results. Milus Banister, MD 07/29/2019 11:15:58 AM This report has been signed  electronically.

## 2019-07-29 NOTE — Progress Notes (Signed)
Temperature- Brian Gonzalez  VS- [name]  Pt's states no medical or surgical changes since previsit or office visit.  

## 2019-07-29 NOTE — Progress Notes (Signed)
Called to room to assist during endoscopic procedure.  Patient ID and intended procedure confirmed with present staff. Received instructions for my participation in the procedure from the performing physician.  

## 2019-07-29 NOTE — Progress Notes (Signed)
PT taken to PACU. Monitors in place. VSS. Report given to RN. 

## 2019-08-02 ENCOUNTER — Telehealth: Payer: Self-pay | Admitting: *Deleted

## 2019-08-02 ENCOUNTER — Encounter: Payer: Self-pay | Admitting: Gastroenterology

## 2019-08-02 ENCOUNTER — Telehealth: Payer: Self-pay

## 2019-08-02 NOTE — Telephone Encounter (Signed)
Left message on f/u call 

## 2019-08-02 NOTE — Telephone Encounter (Signed)
  Follow up Call-  Call back number 07/29/2019  Post procedure Call Back phone  # [phone]  Permission to leave phone message Yes  Some recent data might be hidden     Patient questions:  Do you have a fever, pain , or abdominal swelling? No. Pain Score  0 *  Have you tolerated food without any problems? Yes.    Have you been able to return to your normal activities? Yes.    Do you have any questions about your discharge instructions: Diet   No. Medications  No. Follow up visit  No.  Do you have questions or concerns about your Care? No.  Actions: * If pain score is 4 or above: No action needed, pain <4.  1. Have you developed a fever since your procedure? no  2.   Have you had an respiratory symptoms (SOB or cough) since your procedure? no  3.   Have you tested positive for COVID 19 since your procedure no  4.   Have you had any family members/close contacts diagnosed with the COVID 19 since your procedure?  no   If yes to any of these questions please route to Joylene John, RN and Alphonsa Gin, Therapist, sports.

## 2019-08-16 MED FILL — KETOCONAZOLE 2% CREAM: 2 | 30 days supply | Qty: 30 | Fill #1

## 2019-08-16 MED FILL — HYDROCORTISONE 2.5% CREAM: 2.5 | 15 days supply | Qty: 30 | Fill #1

## 2019-11-08 DIAGNOSIS — B349 Viral infection, unspecified: Secondary | ICD-10-CM | POA: Diagnosis not present

## 2019-11-09 DIAGNOSIS — B349 Viral infection, unspecified: Secondary | ICD-10-CM | POA: Diagnosis not present

## 2020-01-03 MED FILL — KETOCONAZOLE 2% CREAM: 2 | 30 days supply | Qty: 30 | Fill #2

## 2020-01-03 MED FILL — HYDROCORTISONE 2.5% CREAM: 2.5 | 15 days supply | Qty: 30 | Fill #2

## 2020-03-20 ENCOUNTER — Ambulatory Visit: Payer: Self-pay | Attending: Internal Medicine

## 2020-03-20 DIAGNOSIS — Z23 Encounter for immunization: Secondary | ICD-10-CM

## 2020-03-20 NOTE — Progress Notes (Signed)
   Covid-19 Vaccination Clinic  Name:  Brian Gonzalez    MRN: [id number] DOB: 05-19-1961  03/20/2020  Brian Gonzalez was observed post Covid-19 immunization for 15 minutes without incident. He was provided with Vaccine Information Sheet and instruction to access the V-Safe system.   Brian Gonzalez was instructed to call 911 with any severe reactions post vaccine: Marland Kitchen Difficulty breathing  . Swelling of face and throat  . A fast heartbeat  . A bad rash all over body  . Dizziness and weakness   Immunizations Administered    Name Date Dose VIS Date Route   Pfizer COVID-19 Vaccine 03/20/2020  1:14 PM 0.3 mL 12/28/2018 Intramuscular   Manufacturer: [place]   Lot: [id number]   [place]: [id number]

## 2020-04-10 ENCOUNTER — Other Ambulatory Visit: Payer: Self-pay

## 2020-04-10 ENCOUNTER — Other Ambulatory Visit: Payer: Self-pay | Admitting: Physician Assistant

## 2020-04-10 ENCOUNTER — Ambulatory Visit
Admission: RE | Admit: 2020-04-10 | Discharge: 2020-04-10 | Disposition: A | Discharge status: Home / Self Care | Payer: BC Managed Care – PPO | Source: Ambulatory Visit | Attending: Physician Assistant | Admitting: Physician Assistant

## 2020-04-10 DIAGNOSIS — Z111 Encounter for screening for respiratory tuberculosis: Secondary | ICD-10-CM | POA: Diagnosis not present

## 2020-04-10 DIAGNOSIS — E781 Pure hyperglyceridemia: Secondary | ICD-10-CM | POA: Diagnosis not present

## 2020-04-10 DIAGNOSIS — G4733 Obstructive sleep apnea (adult) (pediatric): Secondary | ICD-10-CM | POA: Diagnosis not present

## 2020-04-10 DIAGNOSIS — Z125 Encounter for screening for malignant neoplasm of prostate: Secondary | ICD-10-CM | POA: Diagnosis not present

## 2020-04-10 DIAGNOSIS — Z113 Encounter for screening for infections with a predominantly sexual mode of transmission: Secondary | ICD-10-CM | POA: Diagnosis not present

## 2020-04-10 DIAGNOSIS — Z0000 Encounter for general adult medical examination without abnormal findings: Secondary | ICD-10-CM | POA: Diagnosis not present

## 2020-04-10 DIAGNOSIS — B351 Tinea unguium: Secondary | ICD-10-CM | POA: Diagnosis not present

## 2020-04-14 ENCOUNTER — Other Ambulatory Visit: Payer: Self-pay

## 2020-04-14 ENCOUNTER — Ambulatory Visit: Payer: BC Managed Care – PPO | Attending: Internal Medicine

## 2020-04-14 DIAGNOSIS — Z23 Encounter for immunization: Secondary | ICD-10-CM

## 2020-04-14 NOTE — Progress Notes (Signed)
   Covid-19 Vaccination Clinic  Name:  Brian Gonzalez    MRN: [id number] DOB: [birth year]  04/14/2020  Brian Gonzalez was observed post Covid-19 immunization for 15 minutes without incident. He was provided with Vaccine Information Sheet and instruction to access the V-Safe system.   Brian Gonzalez was instructed to call 911 with any severe reactions post vaccine: Marland Kitchen Difficulty breathing  . Swelling of face and throat  . A fast heartbeat  . A bad rash all over body  . Dizziness and weakness   Immunizations Administered    Name Date Dose VIS Date Route   Pfizer COVID-19 Vaccine 04/14/2020  9:52 AM 0.3 mL 12/28/2018 Intramuscular   Manufacturer: [place]   Lot: [id number]   [place]: [id number]

## 2020-05-10 DIAGNOSIS — R7309 Other abnormal glucose: Secondary | ICD-10-CM | POA: Diagnosis not present

## 2020-05-10 DIAGNOSIS — F319 Bipolar disorder, unspecified: Secondary | ICD-10-CM | POA: Diagnosis not present

## 2020-05-10 DIAGNOSIS — B351 Tinea unguium: Secondary | ICD-10-CM | POA: Diagnosis not present

## 2020-06-04 DIAGNOSIS — G4733 Obstructive sleep apnea (adult) (pediatric): Secondary | ICD-10-CM | POA: Diagnosis not present

## 2020-07-03 DIAGNOSIS — J40 Bronchitis, not specified as acute or chronic: Secondary | ICD-10-CM | POA: Diagnosis not present

## 2020-07-03 MED FILL — HYDROCODONE-CHLORPHEN ER SU: 10-8 | 5 days supply | Qty: 50 | Fill #0

## 2020-07-03 MED FILL — ALBUTEROL SULFATE HFA 108 (: 108 (90 BAS | 33 days supply | Qty: 18 | Fill #0

## 2020-07-12 DIAGNOSIS — F3162 Bipolar disorder, current episode mixed, moderate: Secondary | ICD-10-CM | POA: Diagnosis not present

## 2020-07-12 DIAGNOSIS — G4700 Insomnia, unspecified: Secondary | ICD-10-CM | POA: Diagnosis not present

## 2020-07-12 DIAGNOSIS — F6381 Intermittent explosive disorder: Secondary | ICD-10-CM | POA: Diagnosis not present

## 2020-07-12 DIAGNOSIS — J449 Chronic obstructive pulmonary disease, unspecified: Secondary | ICD-10-CM | POA: Diagnosis not present

## 2020-07-16 DIAGNOSIS — L304 Erythema intertrigo: Secondary | ICD-10-CM | POA: Diagnosis not present

## 2020-08-08 DIAGNOSIS — J449 Chronic obstructive pulmonary disease, unspecified: Secondary | ICD-10-CM | POA: Diagnosis not present

## 2020-08-08 DIAGNOSIS — G4700 Insomnia, unspecified: Secondary | ICD-10-CM | POA: Diagnosis not present

## 2020-08-08 DIAGNOSIS — F6381 Intermittent explosive disorder: Secondary | ICD-10-CM | POA: Diagnosis not present

## 2020-08-08 DIAGNOSIS — F3162 Bipolar disorder, current episode mixed, moderate: Secondary | ICD-10-CM | POA: Diagnosis not present

## 2020-09-06 DIAGNOSIS — G4700 Insomnia, unspecified: Secondary | ICD-10-CM | POA: Diagnosis not present

## 2020-09-06 DIAGNOSIS — F3162 Bipolar disorder, current episode mixed, moderate: Secondary | ICD-10-CM | POA: Diagnosis not present

## 2020-09-06 DIAGNOSIS — J449 Chronic obstructive pulmonary disease, unspecified: Secondary | ICD-10-CM | POA: Diagnosis not present

## 2020-09-06 DIAGNOSIS — F6381 Intermittent explosive disorder: Secondary | ICD-10-CM | POA: Diagnosis not present

## 2020-10-23 DIAGNOSIS — F6381 Intermittent explosive disorder: Secondary | ICD-10-CM | POA: Diagnosis not present

## 2020-10-23 DIAGNOSIS — G4700 Insomnia, unspecified: Secondary | ICD-10-CM | POA: Diagnosis not present

## 2020-10-23 DIAGNOSIS — F3162 Bipolar disorder, current episode mixed, moderate: Secondary | ICD-10-CM | POA: Diagnosis not present

## 2020-10-23 DIAGNOSIS — J449 Chronic obstructive pulmonary disease, unspecified: Secondary | ICD-10-CM | POA: Diagnosis not present

## 2020-10-29 DIAGNOSIS — I10 Essential (primary) hypertension: Secondary | ICD-10-CM | POA: Diagnosis not present

## 2020-10-29 DIAGNOSIS — H0011 Chalazion right upper eyelid: Secondary | ICD-10-CM | POA: Diagnosis not present

## 2020-11-12 DIAGNOSIS — R7303 Prediabetes: Secondary | ICD-10-CM | POA: Diagnosis not present

## 2020-11-29 DIAGNOSIS — R7303 Prediabetes: Secondary | ICD-10-CM | POA: Diagnosis not present

## 2020-11-29 DIAGNOSIS — I10 Essential (primary) hypertension: Secondary | ICD-10-CM | POA: Diagnosis not present

## 2020-12-03 DIAGNOSIS — G4733 Obstructive sleep apnea (adult) (pediatric): Secondary | ICD-10-CM | POA: Diagnosis not present

## 2020-12-05 ENCOUNTER — Ambulatory Visit: Payer: BC Managed Care – PPO | Admitting: Cardiology

## 2020-12-14 ENCOUNTER — Encounter: Payer: Self-pay | Admitting: Cardiology

## 2020-12-14 ENCOUNTER — Ambulatory Visit: Payer: BC Managed Care – PPO | Admitting: Cardiology

## 2020-12-14 ENCOUNTER — Other Ambulatory Visit: Payer: Self-pay

## 2020-12-14 DIAGNOSIS — I10 Essential (primary) hypertension: Secondary | ICD-10-CM | POA: Insufficient documentation

## 2020-12-14 DIAGNOSIS — I4510 Unspecified right bundle-branch block: Secondary | ICD-10-CM

## 2020-12-14 DIAGNOSIS — R9431 Abnormal electrocardiogram [ECG] [EKG]: Secondary | ICD-10-CM | POA: Insufficient documentation

## 2020-12-14 DIAGNOSIS — E8881 Metabolic syndrome: Secondary | ICD-10-CM | POA: Insufficient documentation

## 2020-12-14 DIAGNOSIS — G4733 Obstructive sleep apnea (adult) (pediatric): Secondary | ICD-10-CM | POA: Insufficient documentation

## 2020-12-14 DIAGNOSIS — Z6841 Body Mass Index (BMI) 40.0 and over, adult: Secondary | ICD-10-CM

## 2020-12-14 NOTE — Patient Instructions (Signed)
Medication Instructions:  No changes  *If you need a refill on your cardiac medications before your next appointment, please call your pharmacy*   Lab Work: Not  needed   Testing/Procedures: Will be schedule at [facility] has requested that you have an echocardiogram. Echocardiography is a painless test that uses sound waves to create images of your heart. It provides your doctor with information about the size and shape of your heart and how well your heart's chambers and valves are working. This procedure takes approximately one hour. There are no restrictions for this procedure.    Follow-Up: At [facility], you and your health needs are our priority.  As part of our continuing mission to provide you with exceptional heart care, we have created designated Provider Care Teams.  These Care Teams include your primary Cardiologist (physician) and Advanced Practice Providers (APPs -  Physician Assistants and Nurse Practitioners) who all work together to provide you with the care you need, when you need it.     Your next appointment:   1 to 2  month(s)  The format for your next appointment:   In Person  Provider:   Glenetta Hew, MD   Other Instructions    Coronary Calcium Scan A coronary calcium scan is an imaging test used to look for deposits of plaque in the inner lining of the blood vessels of the heart (coronary arteries). Plaque is made up of calcium, protein, and fatty substances. These deposits of plaque can partly clog and narrow the coronary arteries without producing any symptoms or warning signs. This puts a person at risk for a heart attack. This test is recommended for people who are at moderate risk for heart disease. The test can find plaque deposits before symptoms develop. Tell a health care provider about:  Any allergies you have.  All medicines you are taking, including vitamins, herbs, eye drops, creams, and  over-the-counter medicines.  Any problems you or family members have had with anesthetic medicines.  Any blood disorders you have.  Any surgeries you have had.  Any medical conditions you have.  Whether you are pregnant or may be pregnant. What are the risks? Generally, this is a safe procedure. However, problems may occur, including:  Harm to a pregnant woman and her unborn baby. This test involves the use of radiation. Radiation exposure can be dangerous to a pregnant woman and her unborn baby. If you are pregnant or think you may be pregnant, you should not have this procedure done.  Slight increase in the risk of cancer. This is because of the radiation involved in the test. What happens before the procedure? Ask your health care provider for any specific instructions on how to prepare for this procedure. You may be asked to avoid products that contain caffeine, tobacco, or nicotine for 4 hours before the procedure. What happens during the procedure?  You will undress and remove any jewelry from your neck or chest.  You will put on a hospital gown.  Sticky electrodes will be placed on your chest. The electrodes will be connected to an electrocardiogram (ECG) machine to record a tracing of the electrical activity of your heart.  You will lie down on a curved bed that is attached to the [place].  You may be given medicine to slow down your heart rate so that clear pictures can be created.  You will be moved into the CT scanner, and the CT scanner will  take pictures of your heart. During this time, you will be asked to lie still and hold your breath for 2-3 seconds at a time while each picture of your heart is being taken. The procedure may vary among health care providers and hospitals.   What happens after the procedure?  You can get dressed.  You can return to your normal activities.  It is up to you to get the results of your procedure. Ask your health care provider, or  the department that is doing the procedure, when your results will be ready. Summary  A coronary calcium scan is an imaging test used to look for deposits of plaque in the inner lining of the blood vessels of the heart (coronary arteries). Plaque is made up of calcium, protein, and fatty substances.  Generally, this is a safe procedure. Tell your health care provider if you are pregnant or may be pregnant.  Ask your health care provider for any specific instructions on how to prepare for this procedure.  A CT scanner will take pictures of your heart.  You can return to your normal activities after the scan is done. This information is not intended to replace advice given to you by your health care provider. Make sure you discuss any questions you have with your health care provider. Document Revised: 05/10/2019 Document Reviewed: 05/10/2019 Elsevier Patient Education  2021 [place].  Right Bundle Branch Block  Right bundle branch block (RBBB) is a problem with the way that electrical impulses pass through the heart (electrical conduction abnormality). The heart depends on an electrical pulse to beat normally. The electrical signal for a heartbeat starts in the upper chambers of the heart (atria) and then travels to the two lower chambers (left and rightventricles). An RBBB is a partial or complete block of the pathway that carries the signal to the right ventricle. If you have RBBB, the right side of your heart beats a little more slowly than the left side. RBBB may be a warning of heart disease or a lung problem. What are the causes? This condition may be caused by:  Heart attack (myocardial infarction).  Being born with a heart defect (congenital heart disease).  A blood clot that flows into the lung (pulmonary embolism).  Infection of heart muscle (myocarditis).  High blood pressure. In some cases, the cause may not be known. What increases the risk? The following factors  may make you more likely to develop this condition:  Being male.  Being 99 years of age or older.  Having heart disease.  Having had a heart attack or heart surgery.  Having an enlarged heart.  Having a hole in the walls between the chambers of the heart (septal defect). What are the signs or symptoms? This condition does not typically cause symptoms. How is this diagnosed? This condition may be diagnosed based on an electrocardiogram (ECG). It is often diagnosed when an ECG is done as part of a routine physical. You may also have imaging tests to find out more about your condition. These may include:  Chest X-rays.  Echocardiogram. How is this treated? Treatment may not be needed for this condition if you do not have symptoms or any other heart problems. However, you may need to see your health care provider more often because RBBB can be a warning sign of future heart or lung problems. You may need treatment for another condition that may be causing RBBB. Follow these instructions at home: Lifestyle  Follow instructions  from your health care provider about eating or drinking restrictions.  Follow a heart-healthy diet and maintain a healthy weight. Work with a dietitian to create an eating plan that is best for you.  Do not use any products that contain nicotine or tobacco, such as cigarettes, e-cigarettes, and chewing tobacco. If you need help quitting, ask your health care provider.   Activity  Get regular exercise as told by your health care provider.  Return to your normal activities as told by your health care provider. Ask your health care provider what activities are safe for you. General instructions  Take over-the-counter and prescription medicines only as told by your health care provider.  Keep all follow-up visits as told by your health care provider. This is important. Contact a health care provider if:  You are light-headed.  You faint. Get help right  away if:  You have chest pain.  You have trouble breathing. These symptoms may represent a serious problem that is an emergency. Do not wait to see if the symptoms will go away. Get medical help right away. Call your local emergency services (911 in the [place] Do not drive yourself to the hospital.  Summary  For the heart to beat normally, an electrical signal must travel to the heart's lower right chamber. Right bundle branch block (RBBB) is a partial or complete block of the pathway that carries that signal.  This condition does not typically cause symptoms.  Treatment may not be needed for RBBB if you do not have symptoms or any other heart problems.  You may need to see your health care provider more often because RBBB can be a warning sign of future heart or lung problems. This information is not intended to replace advice given to you by your health care provider. Make sure you discuss any questions you have with your health care provider. Document Revised: 04/19/2019 Document Reviewed: 04/19/2019 Elsevier Patient Education  [place].

## 2020-12-14 NOTE — Progress Notes (Signed)
Primary Care Provider: Shirline Frees, MD Cardiologist: Glenetta Hew, MD Electrophysiologist: None  Clinic Note: Chief Complaint  Patient presents with  . New Patient (Initial Visit)  . Abnormal ECG  . Hypertension    ===================================  ASSESSMENT/PLAN   Problem List Items Addressed This Visit    Morbid obesity with BMI of 50.0-59.9, adult ([place]) (Chronic)    Definitely needs to lose weight. => Also needs to proceed with reestablishing CPAP care.      Relevant Orders   ECHOCARDIOGRAM COMPLETE   Metabolic syndrome (Chronic)    Obesity, hypercholesterolemia, prediabetes and hypertension meet the criteria for metabolic syndrome.  This puts him at high risk.  Very important to aggressively manage his lipids and blood pressure as well as his blood sugars.  If there is any abnormalities on his echocardiogram, would proceed with ischemic evaluation such as coronary CTA, however otherwise if normal, I would recommend coronary calcium scoring for baseline evaluation of cardiovascular risk..      OSA (obstructive sleep apnea) (Chronic)    Needs to restart CPAP.  This will help his hypertension. Since he does have obesity and OSA, would like to evaluate for pulmonary pretension.  Plan: Check 2D echo      Relevant Orders   ECHOCARDIOGRAM COMPLETE   Essential hypertension (Chronic)    Blood pressures well controlled on appropriate medication regimen.  ARB plus HCTZ is appropriate given his demographic.      Relevant Medications   valsartan (DIOVAN) 160 MG tablet   hydrochlorothiazide (HYDRODIURIL) 12.5 MG tablet   Nonspecific abnormal electrocardiogram (ECG) (EKG) (Chronic)    Right pulmonary block with some other vague abnormalities on EKG.  Plan: check 2D echocardiogram to assess for structural or valvular abnormalities.      Relevant Orders   EKG 12-Lead   ECHOCARDIOGRAM COMPLETE   RBBB (Chronic)    Likely benign.  I spent about 5 to 10 minutes  explaining the pathophysiology.  With him having metabolic syndrome of hypertension, hyperlipidemia, prediabetes and obesity, working check an echocardiogram just to get a baseline assessment of his EF.  Ensure that he has no structural abnormalities.  Plan: 2D echo       Relevant Medications   valsartan (DIOVAN) 160 MG tablet   hydrochlorothiazide (HYDRODIURIL) 12.5 MG tablet   Other Relevant Orders   EKG 12-Lead   ECHOCARDIOGRAM COMPLETE      ===================================  HPI:    Brian Gonzalez is a 60 y.o. male with a PMH notable for Morbid Obesity, HTN, Impaired Fasting Glucose (prediabetes), Hyperlipidemia who is being seen today for the evaluation of ABNORMAL EKG (RBBB) & HYPERTENSION at the request of Deeann Cree, MD.  [name] was last seen on January 27 by Marilynne Drivers, PA for routine follow-up on his blood pressure.  He had no active symptoms of chest pain, dyspnea or dizziness.  No headache or blurred vision. -> He was initially started on HCTZ 12.5 mg without significant effect, valsartan 160 mg was started. -> EKG showed right bundle branch block-was referred for cardiology evaluation.  Recent Hospitalizations: None  Reviewed  CV studies:    The following studies were reviewed today: (if available, images/films reviewed: From Epic Chart or Care Everywhere) . None:   Interval History:   Brian Gonzalez is here today, not really sure why.  He has not had any symptoms whatsoever of any chest tightness or pressure with rest or exertion.  He is not very active, but when he does  he is not noticed any symptoms.  He is not having any irregular heartbeats palpitations.  No heart failure symptoms of PND, orthopnea.  No syncope or near syncope, TIA or amaurosis fugax symptoms.  No claudication symptoms. He is morbidly obese and has prediabetes listed as a diagnosis along with hypertension hyperlipidemia.  This combination will categorize him as  metabolic syndrome.  The patient does not have symptoms concerning for COVID-19 infection (fever, chills, cough, or new shortness of breath).   REVIEWED OF SYSTEMS   Review of Systems  Constitutional: Positive for malaise/fatigue (Does not have very good exercise tolerance, but is not very active.). Negative for weight loss.  HENT: Negative for congestion and nosebleeds.   Respiratory: Negative for cough, shortness of breath and wheezing.   Gastrointestinal: Positive for abdominal pain, blood in stool (Off and on with wiping or related to anal fissure.) and constipation. Negative for diarrhea and melena.       Intermittent IBS symptoms.  Also intermittently has diverticulitis or diverticular pain.  Genitourinary: Negative for hematuria.  Musculoskeletal: Positive for joint pain (Knees bother him). Negative for falls.  Neurological: Negative for dizziness, focal weakness and weakness.  Psychiatric/Behavioral: Negative for depression and memory loss. The patient is not nervous/anxious and does not have insomnia.        He seems like he has sort of on edge personality.    I have reviewed and (if needed) personally updated the patient's problem list, medications, allergies, past medical and surgical history, social and family history.   PAST MEDICAL HISTORY   Past Medical History:  Diagnosis Date  . Adult ADHD    Not currently being treated  . Allergy   . Bilateral edema of lower extremity    Wears support stockings.  . Bipolar affective disorder in full remission ([place])   . Chronic allergic rhinitis   . Diverticulosis of intestine    With intermittent diverticulitis  . Essential hypertension   . GERD (gastroesophageal reflux disease)   . Hyperlipidemia due to dietary fat intake   . IBS (irritable bowel syndrome)   . OSA (obstructive sleep apnea)    Has been intolerant of facemask, is looking into nasal pillow  . Prediabetes     PAST SURGICAL HISTORY   Past Surgical History:   Procedure Laterality Date  . APPENDECTOMY    . COLONOSCOPY    . Leimyoma    . NASAL SINUS SURGERY     x3  . NASAL TURBINATE REDUCTION    . RHINOPLASTY    . right knee surgery      Immunization History  Administered Date(s) Administered  . PFIZER(Purple Top)SARS-COV-2 Vaccination 03/20/2020, 04/14/2020    MEDICATIONS/ALLERGIES   Current Meds  Medication Sig  . albuterol (VENTOLIN HFA) 108 (90 Base) MCG/ACT inhaler as needed.  . Cholecalciferol (VITAMIN D PO) Take by mouth daily.  Marland Kitchen gabapentin (NEURONTIN) 600 MG tablet Take 600 mg by mouth at bedtime.  . hydrochlorothiazide (HYDRODIURIL) 12.5 MG tablet Take 12.5 mg by mouth every morning.  Marland Kitchen ketoconazole (NIZORAL) 2 % cream Use as directed.  . tacrolimus (PROTOPIC) 0.1 % ointment Apply 1 application topically as needed.  . valsartan (DIOVAN) 160 MG tablet Take 160 mg by mouth daily.   Current Facility-Administered Medications for the 12/14/20 encounter (Office Visit) with Leonie Man, MD  Medication  . 0.9 %  sodium chloride infusion    No Known Allergies  SOCIAL HISTORY/FAMILY HISTORY   Reviewed in Epic:  Pertinent findings:  Social  History   Tobacco Use  . Smoking status: Former Smoker    Quit date: 07/23/2012    Years since quitting: 8.4  . Smokeless tobacco: Never Used  . Tobacco comment: Was an off-and-on smoker for 10 years.  Did not smoke much.  Vaping Use  . Vaping Use: Former  Substance Use Topics  . Alcohol use: Yes    Comment: occasionally  . Drug use: No   Social History   Social History Narrative   He is a divorced father of 17 daughters-1 daughter divorced, one married and one single.  He has 3 grandchildren.   Drinks 1 cup of coffee every morning.   Usually drinks maybe 1 shot of whiskey or a glass of wine a day.  Does not like beer because it makes him bloated.   He tries to walk daily, but his favorite form of exercise is kayaking.    OBJCTIVE -PE, EKG, labs   Wt Readings from Last 3  Encounters:  12/14/20 (!) 315 lb (142.9 kg)  07/29/19 284 lb (128.8 kg)  07/14/19 284 lb (128.8 kg)    Physical Exam: BP 128/70   Pulse 74   Ht 5\' 6"  (1.676 m)   Wt (!) 315 lb (142.9 kg)   SpO2 95%   BMI 50.84 kg/m  Physical Exam Vitals reviewed.  Constitutional:      General: He is not in acute distress.    Appearance: Normal appearance. He is obese. He is not ill-appearing, toxic-appearing or diaphoretic (Not diaphoretic, but sweating).     Comments: Morbidly obese.  Mostly truncal  HENT:     Head: Normocephalic and atraumatic.  Neck:     Vascular: No carotid bruit, hepatojugular reflux or JVD (Not really able to assess due to body habitus).  Cardiovascular:     Rate and Rhythm: Normal rate and regular rhythm.  No extrasystoles are present.    Chest Wall: PMI is not displaced.     Pulses: Intact distal pulses. Decreased pulses (Difficult to palpate due to body habitus.).     Heart sounds: Heart sounds are distant (Normal S1 and split S2.). No murmur heard. No friction rub. No gallop.   Pulmonary:     Effort: Pulmonary effort is normal. No respiratory distress.     Breath sounds: Normal breath sounds.     Comments: Somewhat distant breath sounds Chest:     Chest wall: No tenderness.  Abdominal:     General: Bowel sounds are normal. There is no distension.     Palpations: There is no mass.     Tenderness: There is no abdominal tenderness.     Comments: Significant obesity.  Unable to assess HSM  Musculoskeletal:        General: Swelling (Trivial) present. Normal range of motion.     Cervical back: Normal range of motion and neck supple.  Skin:    General: Skin is warm.     Comments: Somewhat sweaty  Neurological:     General: No focal deficit present.     Mental Status: He is alert and oriented to person, place, and time.     Motor: No weakness.     Gait: Gait normal.  Psychiatric:        Mood and Affect: Mood normal.        Behavior: Behavior normal.         Thought Content: Thought content normal.        Judgment: Judgment normal.  Adult ECG Report  Rate: 74;  Rhythm: normal sinus rhythm and ~Left axis deviation, RBBB.  CRO IMI, age-indeterminate;   Narrative Interpretation: No prior EKG  Recent Labs:    April 10, 2020: TC 140, TG 247, HDL 41, LDL 60.  11/12/2020: A1c 6.1.  11/29/2020: Hgb 17.2. Cr 0.0, K+ 3.7 No results found for: CHOL, HDL, LDLCALC, LDLDIRECT, TRIG, CHOLHDL No results found for: CREATININE, BUN, NA, K, CL, CO2 No flowsheet data found.  No results found for: TSH  ==================================================  COVID-19 Education: The signs and symptoms of COVID-19 were discussed with the patient and how to seek care for testing (follow up with PCP or arrange E-visit).   The importance of social distancing and COVID-19 vaccination was discussed today. The patient is practicing social distancing & Masking.   I spent a total of 60minutes with the patient spent in direct patient consultation.  Additional time spent with chart review  / charting (studies, outside notes, etc): 22 min Total Time: 52 min  Current medicines are reviewed at length with the patient today.  (+/- concerns) n/a  This visit occurred during the SARS-CoV-2 public health emergency.  Safety protocols were in place, including screening questions prior to the visit, additional usage of staff PPE, and extensive cleaning of exam room while observing appropriate contact time as indicated for disinfecting solutions.  Notice: This dictation was prepared with Dragon dictation along with smaller phrase technology. Any transcriptional errors that result from this process are unintentional and may not be corrected upon review.  Patient Instructions / Medication Changes & Studies & Tests Ordered   Patient Instructions   Medication Instructions:  No changes  *If you need a refill on your cardiac medications before your next appointment, please call  your pharmacy*   Lab Work: Not  needed   Testing/Procedures: Will be schedule at [facility] has requested that you have an echocardiogram. Echocardiography is a painless test that uses sound waves to create images of your heart. It provides your doctor with information about the size and shape of your heart and how well your heart's chambers and valves are working. This procedure takes approximately one hour. There are no restrictions for this procedure.    Follow-Up: At [facility], you and your health needs are our priority.  As part of our continuing mission to provide you with exceptional heart care, we have created designated Provider Care Teams.  These Care Teams include your primary Cardiologist (physician) and Advanced Practice Providers (APPs -  Physician Assistants and Nurse Practitioners) who all work together to provide you with the care you need, when you need it.     Your next appointment:   1 to 2  month(s)  The format for your next appointment:   In Person  Provider:   Glenetta Hew, MD   Other Instructions    Coronary Calcium Scan A coronary calcium scan is an imaging test used to look for deposits of plaque in the inner lining of the blood vessels of the heart (coronary arteries). Plaque is made up of calcium, protein, and fatty substances. These deposits of plaque can partly clog and narrow the coronary arteries without producing any symptoms or warning signs. This puts a person at risk for a heart attack. This test is recommended for people who are at moderate risk for heart disease. The test can find plaque deposits before symptoms develop. Tell a health care provider about:  Any allergies you  have.  All medicines you are taking, including vitamins, herbs, eye drops, creams, and over-the-counter medicines.  Any problems you or family members have had with anesthetic medicines.  Any blood disorders you  have.  Any surgeries you have had.  Any medical conditions you have.  Whether you are pregnant or may be pregnant. What are the risks? Generally, this is a safe procedure. However, problems may occur, including:  Harm to a pregnant woman and her unborn baby. This test involves the use of radiation. Radiation exposure can be dangerous to a pregnant woman and her unborn baby. If you are pregnant or think you may be pregnant, you should not have this procedure done.  Slight increase in the risk of cancer. This is because of the radiation involved in the test. What happens before the procedure? Ask your health care provider for any specific instructions on how to prepare for this procedure. You may be asked to avoid products that contain caffeine, tobacco, or nicotine for 4 hours before the procedure. What happens during the procedure?  You will undress and remove any jewelry from your neck or chest.  You will put on a hospital gown.  Sticky electrodes will be placed on your chest. The electrodes will be connected to an electrocardiogram (ECG) machine to record a tracing of the electrical activity of your heart.  You will lie down on a curved bed that is attached to the [place].  You may be given medicine to slow down your heart rate so that clear pictures can be created.  You will be moved into the CT scanner, and the CT scanner will take pictures of your heart. During this time, you will be asked to lie still and hold your breath for 2-3 seconds at a time while each picture of your heart is being taken. The procedure may vary among health care providers and hospitals.   What happens after the procedure?  You can get dressed.  You can return to your normal activities.  It is up to you to get the results of your procedure. Ask your health care provider, or the department that is doing the procedure, when your results will be ready. Summary  A coronary calcium scan is an imaging  test used to look for deposits of plaque in the inner lining of the blood vessels of the heart (coronary arteries). Plaque is made up of calcium, protein, and fatty substances.  Generally, this is a safe procedure. Tell your health care provider if you are pregnant or may be pregnant.  Ask your health care provider for any specific instructions on how to prepare for this procedure.  A CT scanner will take pictures of your heart.  You can return to your normal activities after the scan is done. This information is not intended to replace advice given to you by your health care provider. Make sure you discuss any questions you have with your health care provider. Document Revised: 05/10/2019 Document Reviewed: 05/10/2019 Elsevier Patient Education  2021 [employer].  Right Bundle Branch Block  Right bundle branch block (RBBB) is a problem with the way that electrical impulses pass through the heart (electrical conduction abnormality). The heart depends on an electrical pulse to beat normally. The electrical signal for a heartbeat starts in the upper chambers of the heart (atria) and then travels to the two lower chambers (left and rightventricles). An RBBB is a partial or complete block of the pathway that carries the signal to the  right ventricle. If you have RBBB, the right side of your heart beats a little more slowly than the left side. RBBB may be a warning of heart disease or a lung problem. What are the causes? This condition may be caused by:  Heart attack (myocardial infarction).  Being born with a heart defect (congenital heart disease).  A blood clot that flows into the lung (pulmonary embolism).  Infection of heart muscle (myocarditis).  High blood pressure. In some cases, the cause may not be known. What increases the risk? The following factors may make you more likely to develop this condition:  Being male.  Being 61 years of age or older.  Having heart  disease.  Having had a heart attack or heart surgery.  Having an enlarged heart.  Having a hole in the walls between the chambers of the heart (septal defect). What are the signs or symptoms? This condition does not typically cause symptoms. How is this diagnosed? This condition may be diagnosed based on an electrocardiogram (ECG). It is often diagnosed when an ECG is done as part of a routine physical. You may also have imaging tests to find out more about your condition. These may include:  Chest X-rays.  Echocardiogram. How is this treated? Treatment may not be needed for this condition if you do not have symptoms or any other heart problems. However, you may need to see your health care provider more often because RBBB can be a warning sign of future heart or lung problems. You may need treatment for another condition that may be causing RBBB. Follow these instructions at home: Lifestyle  Follow instructions from your health care provider about eating or drinking restrictions.  Follow a heart-healthy diet and maintain a healthy weight. Work with a dietitian to create an eating plan that is best for you.  Do not use any products that contain nicotine or tobacco, such as cigarettes, e-cigarettes, and chewing tobacco. If you need help quitting, ask your health care provider.   Activity  Get regular exercise as told by your health care provider.  Return to your normal activities as told by your health care provider. Ask your health care provider what activities are safe for you. General instructions  Take over-the-counter and prescription medicines only as told by your health care provider.  Keep all follow-up visits as told by your health care provider. This is important. Contact a health care provider if:  You are light-headed.  You faint. Get help right away if:  You have chest pain.  You have trouble breathing. These symptoms may represent a serious problem that is  an emergency. Do not wait to see if the symptoms will go away. Get medical help right away. Call your local emergency services (911 in the [place] Do not drive yourself to the hospital.  Summary  For the heart to beat normally, an electrical signal must travel to the heart's lower right chamber. Right bundle branch block (RBBB) is a partial or complete block of the pathway that carries that signal.  This condition does not typically cause symptoms.  Treatment may not be needed for RBBB if you do not have symptoms or any other heart problems.  You may need to see your health care provider more often because RBBB can be a warning sign of future heart or lung problems. This information is not intended to replace advice given to you by your health care provider. Make sure you discuss any questions you have with your  health care provider. Document Revised: 04/19/2019 Document Reviewed: 04/19/2019 Elsevier Patient Education  [place].      Studies Ordered:   Orders Placed This Encounter  Procedures  . EKG 12-Lead  . ECHOCARDIOGRAM COMPLETE     Glenetta Hew, M.D., M.S. Interventional Cardiologist   Pager # [phone] Phone # [phone] [address] [place], [place] [address]   Thank you for choosing Heart[facility] at [facility]!!

## 2020-12-15 ENCOUNTER — Encounter: Payer: Self-pay | Admitting: Cardiology

## 2020-12-15 NOTE — Assessment & Plan Note (Signed)
Likely benign.  I spent about 5 to 10 minutes explaining the pathophysiology.  With him having metabolic syndrome of hypertension, hyperlipidemia, prediabetes and obesity, working check an echocardiogram just to get a baseline assessment of his EF.  Ensure that he has no structural abnormalities.  Plan: 2D echo

## 2020-12-15 NOTE — Assessment & Plan Note (Signed)
Blood pressures well controlled on appropriate medication regimen.  ARB plus HCTZ is appropriate given his demographic.

## 2020-12-15 NOTE — Assessment & Plan Note (Addendum)
Obesity, hypercholesterolemia, prediabetes and hypertension meet the criteria for metabolic syndrome.  This puts him at high risk.  Very important to aggressively manage his lipids and blood pressure as well as his blood sugars.  If there is any abnormalities on his echocardiogram, would proceed with ischemic evaluation such as coronary CTA, however otherwise if normal, I would recommend coronary calcium scoring for baseline evaluation of cardiovascular risk.Brian Gonzalez

## 2020-12-15 NOTE — Assessment & Plan Note (Signed)
Right pulmonary block with some other vague abnormalities on EKG.  Plan: check 2D echocardiogram to assess for structural or valvular abnormalities.

## 2020-12-15 NOTE — Assessment & Plan Note (Signed)
Definitely needs to lose weight. => Also needs to proceed with reestablishing CPAP care.

## 2020-12-15 NOTE — Assessment & Plan Note (Signed)
Needs to restart CPAP.  This will help his hypertension. Since he does have obesity and OSA, would like to evaluate for pulmonary pretension.  Plan: Check 2D echo

## 2020-12-19 DIAGNOSIS — G4700 Insomnia, unspecified: Secondary | ICD-10-CM | POA: Diagnosis not present

## 2020-12-19 DIAGNOSIS — F6381 Intermittent explosive disorder: Secondary | ICD-10-CM | POA: Diagnosis not present

## 2020-12-19 DIAGNOSIS — F3162 Bipolar disorder, current episode mixed, moderate: Secondary | ICD-10-CM | POA: Diagnosis not present

## 2020-12-19 DIAGNOSIS — J449 Chronic obstructive pulmonary disease, unspecified: Secondary | ICD-10-CM | POA: Diagnosis not present

## 2020-12-31 ENCOUNTER — Other Ambulatory Visit: Payer: Self-pay

## 2020-12-31 ENCOUNTER — Ambulatory Visit (HOSPITAL_COMMUNITY): Payer: BC Managed Care – PPO | Attending: Cardiology

## 2020-12-31 DIAGNOSIS — I4510 Unspecified right bundle-branch block: Secondary | ICD-10-CM | POA: Diagnosis not present

## 2020-12-31 DIAGNOSIS — Z6841 Body Mass Index (BMI) 40.0 and over, adult: Secondary | ICD-10-CM | POA: Diagnosis not present

## 2020-12-31 DIAGNOSIS — G4733 Obstructive sleep apnea (adult) (pediatric): Secondary | ICD-10-CM | POA: Insufficient documentation

## 2020-12-31 DIAGNOSIS — R9431 Abnormal electrocardiogram [ECG] [EKG]: Secondary | ICD-10-CM

## 2020-12-31 HISTORY — PX: TRANSTHORACIC ECHOCARDIOGRAM: SHX275

## 2020-12-31 LAB — ECHOCARDIOGRAM COMPLETE
Area-P 1/2: 4.21 cm2
S' Lateral: 3.6 cm

## 2020-12-31 MED ORDER — PERFLUTREN LIPID MICROSPHERE
1.0000 mL | INTRAVENOUS | Status: AC | PRN
Start: 2020-12-31 — End: 2020-12-31
  Administered 2020-12-31: 3 mL via INTRAVENOUS

## 2021-01-11 DIAGNOSIS — R7303 Prediabetes: Secondary | ICD-10-CM | POA: Diagnosis not present

## 2021-01-11 DIAGNOSIS — I10 Essential (primary) hypertension: Secondary | ICD-10-CM | POA: Diagnosis not present

## 2021-01-28 DIAGNOSIS — G4733 Obstructive sleep apnea (adult) (pediatric): Secondary | ICD-10-CM | POA: Diagnosis not present

## 2021-02-04 ENCOUNTER — Ambulatory Visit: Payer: BC Managed Care – PPO | Admitting: Cardiology

## 2021-02-07 DIAGNOSIS — G4733 Obstructive sleep apnea (adult) (pediatric): Secondary | ICD-10-CM | POA: Diagnosis not present

## 2021-02-20 NOTE — Progress Notes (Signed)
Cardiology Office Note   Date:  02/22/2021   ID:  Kainon, Varady 1961/03/04, MRN [id number]  PCP:  Shirline Frees, MD  Cardiologist:  Dr. Ellyn Hack  CC: Follow-up blood pressure control  History of Present Illness: Brian Gonzalez is a 60 y.o. male who presents for follow-up after initially being seen as a new patient by Dr. Ellyn Hack on 12/14/2020.  This was requested by his primary care provider in the setting of abnormal EKG with a right bundle branch block, and hypertension.  He has a history of morbid obesity, hypertension, and prediabetes, OSA, along with hyperlipidemia.  At the time of that visit the patient had no complaints of any active chest discomfort dyspnea or dizziness.  He also denied having any palpitations, PND, or orthopnea.    He admitted to poor exercise tolerance.  It was reported that he did have some abdominal pain and occasional blood in the stool that he noticed after wiping related to anal fissure and constipation.  He also had a history of intermittent IBS symptoms.  He was noted to have a former history of smoking and has quit for 9 years.  Dr. Ellyn Hack ordered an echocardiogram, and a coronary CTA would be planned if echo was abnormal.  He is here for follow-up to discuss test results..   Echocardiogram revealed normal LV systolic function of 50 to 75%.  The ventricle appeared to be mildly thickened with abnormal grade 2 diastolic dysfunction with mildly dilated left atrium.  Since being seen last, the patient has had medication adjustments per PCP to increase valsartan dose from 160 mg daily to 320 mg daily.  He continues to take HCTZ 12.5 mg daily as well.  He states that he has been more compliant with CPAP as he has a new machine that automatically adjusts the pressures.  He admits to continuing to eat fast food and being sedentary as he travels a good bit for his job.  Past Medical History:  Diagnosis Date  . Adult ADHD    Not currently being treated  . Allergy    . Bilateral edema of lower extremity    Wears support stockings.  . Bipolar affective disorder in full remission ([place])   . Chronic allergic rhinitis   . Diverticulosis of intestine    With intermittent diverticulitis  . Essential hypertension   . GERD (gastroesophageal reflux disease)   . Hyperlipidemia due to dietary fat intake   . IBS (irritable bowel syndrome)   . OSA (obstructive sleep apnea)    Has been intolerant of facemask, is looking into nasal pillow  . Prediabetes     Past Surgical History:  Procedure Laterality Date  . APPENDECTOMY    . COLONOSCOPY    . Leimyoma    . NASAL SINUS SURGERY     x3  . NASAL TURBINATE REDUCTION    . RHINOPLASTY    . right knee surgery       Current Outpatient Medications  Medication Sig Dispense Refill  . albuterol (VENTOLIN HFA) 108 (90 Base) MCG/ACT inhaler as needed.    . Cholecalciferol (VITAMIN D PO) Take by mouth daily.    Marland Kitchen gabapentin (NEURONTIN) 600 MG tablet Take 600 mg by mouth at bedtime.    Marland Kitchen ketoconazole (NIZORAL) 2 % cream Use as directed.    . tacrolimus (PROTOPIC) 0.1 % ointment Apply 1 application topically as needed.    . valsartan (DIOVAN) 160 MG tablet Take 160 mg by mouth daily.    Marland Kitchen  hydrochlorothiazide (HYDRODIURIL) 12.5 MG tablet Take 1 tablet (12.5 mg total) by mouth every morning. 90 tablet 3  . valsartan (DIOVAN) 320 MG tablet Take 320 mg by mouth daily.     Current Facility-Administered Medications  Medication Dose Route Frequency Provider Last Rate Last Admin  . 0.9 %  sodium chloride infusion  500 mL Intravenous Once Milus Banister, MD        Allergies:   Patient has no known allergies.    Social History:  The patient  reports that he quit smoking about 8 years ago. He has never used smokeless tobacco. He reports current alcohol use. He reports that he does not use drugs.   Family History:  The patient's family history is not on file.    ROS: All other systems are reviewed and negative. Unless  otherwise mentioned in H&P    PHYSICAL EXAM: VS:  BP 134/70   Pulse 88   Ht 5\' 6"  (1.676 m)   Wt (!) 304 lb 3.2 oz (138 kg)   SpO2 96%   BMI 49.10 kg/m  , BMI Body mass index is 49.1 kg/m. GEN: Well nourished, well developed, in no acute distress, morbidly obese HEENT: normal Neck: no JVD, carotid bruits, or masses Cardiac: RRR; with S4, murmurs, no rubs, or gallops,no edema  Respiratory:  Clear to auscultation bilaterally, normal work of breathing GI: soft, nontender, nondistended, + BS, central obesity noted MS: no deformity or atrophy Skin: warm and dry, no rash Neuro:  Strength and sensation are intact Psych: euthymic mood, full affect   EKG: Not completed this office visit.  Recent Labs: No results found for requested labs within last 8760 hours.    Lipid Panel No results found for: CHOL, TRIG, HDL, CHOLHDL, VLDL, LDLCALC, LDLDIRECT    Wt Readings from Last 3 Encounters:  02/22/21 (!) 304 lb 3.2 oz (138 kg)  12/14/20 (!) 315 lb (142.9 kg)  07/29/19 284 lb (128.8 kg)      Other studies Reviewed: Echocardiogram 01-27-2021 1. Left ventricular ejection fraction, by estimation, is 60 to 65%. The  left ventricle has normal function. The left ventricle has no regional  wall motion abnormalities. There is mild concentric left ventricular  hypertrophy. Left ventricular diastolic  parameters are consistent with Grade II diastolic dysfunction  (pseudonormalization).  2. Right ventricular systolic function is mildly reduced. The right  ventricular size is normal. Tricuspid regurgitation signal is inadequate  for assessing PA pressure.  3. Left atrial size was mildly dilated.  4. The mitral valve is normal in structure. No evidence of mitral valve  regurgitation. No evidence of mitral stenosis.  5. The aortic valve is normal in structure. Aortic valve regurgitation is  not visualized. No aortic stenosis is present.  6. Aortic dilatation noted. There is mild  dilatation of the aortic root,  measuring 41 mm. There is mild dilatation of the ascending aorta,  measuring 37 mm.    ASSESSMENT AND PLAN:  1.  Hypertension: Much better controlled with increased dose of valsartan to 320 mg daily from 160 mg daily.  We have discussed increasing his exercise by parking further away and parking lots and walking more on his work sites to begin to become more active.  We also discussed eliminating fast food from his diet especially as he travels a lot to avoid continued weight gain.  I am going to check a BMET today for kidney function with increased dose of ARB.  I reviewed his echocardiogram results with  him to include diastolic dysfunction.  He verbalized understanding and questions have been answered.  [place]  2.  OSA: He has become more compliant with CPAP as he has received a new machine which automatically adjust the pressures.  I have encouraged him to be consistent with this use to assist in high lung pressures and better regulation of overall blood pressure.  It is my hope that as he is sleeping better he will have more energy and wants to be more active.  3.  Morbid obesity: It is essential for overall health that he lose weight at least 50 pounds in order to reduce the strain on his heart, reduced blood pressure, and improve overall health status.  He is aware of this.  Hopefully he will be motivated to be more active as his blood pressure is better controlled along with his sleep apnea.  Current medicines are reviewed at length with the patient today.  I have spent 25 minutes dedicated to the care of this patient on the date of this encounter to include pre-visit review of records, assessment, management and diagnostic testing,with shared decision making.  Labs/ tests ordered today include: BMET Phill Myron. West Pugh, ANP, AACC   02/22/2021 10:16 AM    [address] Office [phone] Fax [phone]  Notice: This dictation was prepared with Dragon dictation along with smaller phrase technology. Any transcriptional errors that result from this process are unintentional and may not be corrected upon review.

## 2021-02-22 ENCOUNTER — Ambulatory Visit: Payer: BC Managed Care – PPO | Admitting: Adult Health

## 2021-02-22 ENCOUNTER — Other Ambulatory Visit: Payer: Self-pay

## 2021-02-22 ENCOUNTER — Encounter: Payer: Self-pay | Admitting: Adult Health

## 2021-02-22 VITALS — BP 134/70 | HR 88 | Ht 66.0 in | Wt 304.2 lb

## 2021-02-22 DIAGNOSIS — Z79899 Other long term (current) drug therapy: Secondary | ICD-10-CM

## 2021-02-22 DIAGNOSIS — I10 Essential (primary) hypertension: Secondary | ICD-10-CM

## 2021-02-22 LAB — BASIC METABOLIC PANEL
BUN/Creatinine Ratio: 12 (ref 9–20)
BUN: 14 mg/dL (ref 6–24)
CO2: 24 mmol/L (ref 20–29)
Calcium: 9.6 mg/dL (ref 8.7–10.2)
Chloride: 98 mmol/L (ref 96–106)
Creatinine, Ser: 1.15 mg/dL (ref 0.76–1.27)
Glucose: 109 mg/dL — ABNORMAL HIGH (ref 65–99)
Potassium: 4.3 mmol/L (ref 3.5–5.2)
Sodium: 138 mmol/L (ref 134–144)
eGFR: 74 mL/min/{1.73_m2} (ref >59)

## 2021-02-22 LAB — MAGNESIUM: Magnesium: 2.2 mg/dL (ref 1.6–2.3)

## 2021-02-22 MED ORDER — HYDROCHLOROTHIAZIDE 12.5 MG PO TABS: 12.5000 mg | ORAL_TABLET | Freq: Every morning | ORAL | 3 refills | Status: DC

## 2021-02-22 NOTE — Patient Instructions (Signed)
Medication Instructions:  Continue current medications  *If you need a refill on your cardiac medications before your next appointment, please call your pharmacy*   Lab Work: BMP and Magnesium  If you have labs (blood work) drawn today and your tests are completely normal, you will receive your results only by: Marland Kitchen MyChart Message (if you have MyChart) OR . A paper copy in the mail If you have any lab test that is abnormal or we need to change your treatment, we will call you to review the results.   Testing/Procedures: None Ordered   Follow-Up: At [facility], you and your health needs are our priority.  As part of our continuing mission to provide you with exceptional heart care, we have created designated Provider Care Teams.  These Care Teams include your primary Cardiologist (physician) and Advanced Practice Providers (APPs -  Physician Assistants and Nurse Practitioners) who all work together to provide you with the care you need, when you need it.  We recommend signing up for the patient portal called "MyChart".  Sign up information is provided on this After Visit Summary.  MyChart is used to connect with patients for Virtual Visits (Telemedicine).  Patients are able to view lab/test results, encounter notes, upcoming appointments, etc.  Non-urgent messages can be sent to your provider as well.   To learn more about what you can do with MyChart, go to [email or web address].    Your next appointment:   6 month(s)  The format for your next appointment:   In Person  Provider:   You may see Glenetta Hew, MD or one of the following Advanced Practice Providers on your designated Care Team:    Rosaria Ferries, PA-C  Jory Sims, DNP, ANP

## 2021-02-28 DIAGNOSIS — G4733 Obstructive sleep apnea (adult) (pediatric): Secondary | ICD-10-CM | POA: Diagnosis not present

## 2021-03-30 DIAGNOSIS — G4733 Obstructive sleep apnea (adult) (pediatric): Secondary | ICD-10-CM | POA: Diagnosis not present

## 2021-04-11 DIAGNOSIS — Z0000 Encounter for general adult medical examination without abnormal findings: Secondary | ICD-10-CM | POA: Diagnosis not present

## 2021-04-11 DIAGNOSIS — Z125 Encounter for screening for malignant neoplasm of prostate: Secondary | ICD-10-CM | POA: Diagnosis not present

## 2021-04-11 DIAGNOSIS — I10 Essential (primary) hypertension: Secondary | ICD-10-CM | POA: Diagnosis not present

## 2021-04-11 DIAGNOSIS — E781 Pure hyperglyceridemia: Secondary | ICD-10-CM | POA: Diagnosis not present

## 2021-04-11 DIAGNOSIS — J310 Chronic rhinitis: Secondary | ICD-10-CM | POA: Diagnosis not present

## 2021-04-11 DIAGNOSIS — B351 Tinea unguium: Secondary | ICD-10-CM | POA: Diagnosis not present

## 2021-04-30 DIAGNOSIS — G4733 Obstructive sleep apnea (adult) (pediatric): Secondary | ICD-10-CM | POA: Diagnosis not present

## 2021-05-15 DIAGNOSIS — G4700 Insomnia, unspecified: Secondary | ICD-10-CM | POA: Diagnosis not present

## 2021-05-15 DIAGNOSIS — F3162 Bipolar disorder, current episode mixed, moderate: Secondary | ICD-10-CM | POA: Diagnosis not present

## 2021-05-15 DIAGNOSIS — F6381 Intermittent explosive disorder: Secondary | ICD-10-CM | POA: Diagnosis not present

## 2021-05-30 DIAGNOSIS — G4733 Obstructive sleep apnea (adult) (pediatric): Secondary | ICD-10-CM | POA: Diagnosis not present

## 2021-06-04 ENCOUNTER — Other Ambulatory Visit: Payer: Self-pay

## 2021-06-04 ENCOUNTER — Ambulatory Visit: Payer: BC Managed Care – PPO | Admitting: Podiatry

## 2021-06-04 DIAGNOSIS — L600 Ingrowing nail: Secondary | ICD-10-CM | POA: Diagnosis not present

## 2021-06-04 DIAGNOSIS — M79676 Pain in unspecified toe(s): Secondary | ICD-10-CM

## 2021-06-04 NOTE — Patient Instructions (Signed)

## 2021-06-04 NOTE — Progress Notes (Signed)
  Subjective:  Patient ID: Brian Gonzalez, male    DOB: 1961-10-28,  MRN: [id number]  Chief Complaint  Patient presents with   Ingrown Toenail    Right great ingrown x 2 years. Painful when wearing shoes and socks.    60 y.o. male presents with the above complaint. History confirmed with patient.   Objective:  Physical Exam: warm, good capillary refill, no trophic changes or ulcerative lesions, normal DP and PT pulses, and normal sensory exam.  Painful ingrowing nail at  medial border of the right, hallux; without warmth, erythema or drainage  Assessment:   1. Pain around toenail   2. Ingrown nail      Plan:  Patient was evaluated and treated and all questions answered.  Ingrown Nail, right -Patient elects to proceed with ingrown toenail removal today -Ingrown nail excised. See procedure note. -Educated on post-procedure care including soaking. Written instructions provided.  Procedure: Excision of Ingrown Toenail Location: Right 1st toe  medial border Anesthesia: Lidocaine 1% plain; 46m, digital block. Skin Prep: Betadine. Dressing: Silvadene; telfa; dry, sterile, compression dressing. Technique: Following skin prep, the toe was exsanguinated and a tourniquet was secured at the base of the toe. The affected nail border was freed, split with a nail splitter, and excised. Chemical matrixectomy was then performed with phenol and irrigated out with alcohol. The tourniquet was then removed and sterile dressing applied. Disposition: Patient tolerated procedure well.   Return in about 2 weeks (around 06/18/2021) for Nail Check.

## 2021-06-06 ENCOUNTER — Telehealth: Payer: Self-pay | Admitting: *Deleted

## 2021-06-06 NOTE — Telephone Encounter (Signed)
Patient is calling and wanted to know how long should he wait to get in pool or water(recent ingrown procedure). Please advise.

## 2021-06-06 NOTE — Telephone Encounter (Signed)
Please inform 1 week from procedure

## 2021-06-07 NOTE — Telephone Encounter (Signed)
Returned call to patient and gave information per Dr March Rummage, verbalized understanding.

## 2021-06-18 ENCOUNTER — Other Ambulatory Visit: Payer: Self-pay

## 2021-06-18 ENCOUNTER — Ambulatory Visit: Payer: BC Managed Care – PPO | Admitting: Podiatry

## 2021-06-18 DIAGNOSIS — M79676 Pain in unspecified toe(s): Secondary | ICD-10-CM

## 2021-06-18 DIAGNOSIS — L600 Ingrowing nail: Secondary | ICD-10-CM | POA: Diagnosis not present

## 2021-06-18 NOTE — Progress Notes (Signed)
  Subjective:  Patient ID: Brian Gonzalez, male    DOB: Apr 08, 1961,  MRN: [id number]  Chief Complaint  Patient presents with   Ingrown Toenail    Recheck of right great ingrown removal. Pt complains of some aches. But overall healing well.     60 y.o. male presents for follow up of nail procedure. History confirmed with patient.   Objective:  Physical Exam: Ingrown nail avulsion site: overlying soft crust, no warmth, no drainage, and no erythema Assessment:   1. Ingrown nail   2. Pain around toenail    Plan:  Patient was evaluated and treated and all questions answered.  S/p Ingrown Toenail Excision, right -Healing well without issue. -Gently debrided nail removal site of excess HPK -Soak tonight, apply neosporin and bandaid qOD x1 week. -Discussed return precautions. -F/u PRN

## 2021-06-30 DIAGNOSIS — G4733 Obstructive sleep apnea (adult) (pediatric): Secondary | ICD-10-CM | POA: Diagnosis not present

## 2021-07-10 DIAGNOSIS — G4700 Insomnia, unspecified: Secondary | ICD-10-CM | POA: Diagnosis not present

## 2021-07-10 DIAGNOSIS — F3162 Bipolar disorder, current episode mixed, moderate: Secondary | ICD-10-CM | POA: Diagnosis not present

## 2021-07-10 DIAGNOSIS — J449 Chronic obstructive pulmonary disease, unspecified: Secondary | ICD-10-CM | POA: Diagnosis not present

## 2021-07-10 DIAGNOSIS — F6381 Intermittent explosive disorder: Secondary | ICD-10-CM | POA: Diagnosis not present

## 2021-07-17 DIAGNOSIS — Z20822 Contact with and (suspected) exposure to covid-19: Secondary | ICD-10-CM | POA: Diagnosis not present

## 2021-07-22 DIAGNOSIS — Z20822 Contact with and (suspected) exposure to covid-19: Secondary | ICD-10-CM | POA: Diagnosis not present

## 2021-07-24 ENCOUNTER — Other Ambulatory Visit: Payer: Self-pay

## 2021-07-24 ENCOUNTER — Encounter (HOSPITAL_COMMUNITY): Payer: Self-pay

## 2021-07-24 ENCOUNTER — Emergency Department (HOSPITAL_COMMUNITY)
Admission: EM | Admit: 2021-07-24 | Discharge: 2021-07-24 | Disposition: A | Discharge status: Home / Self Care | Payer: BC Managed Care – PPO | Attending: Emergency Medicine | Admitting: Emergency Medicine

## 2021-07-24 ENCOUNTER — Emergency Department (HOSPITAL_COMMUNITY): Payer: BC Managed Care – PPO

## 2021-07-24 DIAGNOSIS — K5792 Diverticulitis of intestine, part unspecified, without perforation or abscess without bleeding: Secondary | ICD-10-CM | POA: Insufficient documentation

## 2021-07-24 DIAGNOSIS — I10 Essential (primary) hypertension: Secondary | ICD-10-CM | POA: Insufficient documentation

## 2021-07-24 DIAGNOSIS — U071 COVID-19: Secondary | ICD-10-CM | POA: Diagnosis not present

## 2021-07-24 DIAGNOSIS — R109 Unspecified abdominal pain: Secondary | ICD-10-CM | POA: Diagnosis not present

## 2021-07-24 DIAGNOSIS — Z87891 Personal history of nicotine dependence: Secondary | ICD-10-CM | POA: Insufficient documentation

## 2021-07-24 DIAGNOSIS — Z79899 Other long term (current) drug therapy: Secondary | ICD-10-CM | POA: Diagnosis not present

## 2021-07-24 LAB — CBC WITH DIFFERENTIAL/PLATELET
Abs Immature Granulocytes: 0.04 10*3/uL (ref 0.00–0.07)
Basophils Absolute: 0 10*3/uL (ref 0.0–0.1)
Basophils Relative: 0 %
Eosinophils Absolute: 0.2 10*3/uL (ref 0.0–0.5)
Eosinophils Relative: 1 %
HCT: 48 % (ref 39.0–52.0)
Hemoglobin: 16.3 g/dL (ref 13.0–17.0)
Immature Granulocytes: 0 %
Lymphocytes Relative: 14 %
Lymphs Abs: 1.5 10*3/uL (ref 0.7–4.0)
MCH: 31.2 pg (ref 26.0–34.0)
MCHC: 34 g/dL (ref 30.0–36.0)
MCV: 92 fL (ref 80.0–100.0)
Monocytes Absolute: 0.8 10*3/uL (ref 0.1–1.0)
Monocytes Relative: 7 %
Neutro Abs: 8.8 10*3/uL — ABNORMAL HIGH (ref 1.7–7.7)
Neutrophils Relative %: 78 %
Platelets: 182 10*3/uL (ref 150–400)
RBC: 5.22 MIL/uL (ref 4.22–5.81)
RDW: 13.3 % (ref 11.5–15.5)
WBC: 11.4 10*3/uL — ABNORMAL HIGH (ref 4.0–10.5)
nRBC: 0 % (ref 0.0–0.2)

## 2021-07-24 LAB — URINALYSIS, ROUTINE W REFLEX MICROSCOPIC
Bilirubin Urine: NEGATIVE
Glucose, UA: NEGATIVE mg/dL
Hgb urine dipstick: NEGATIVE
Ketones, ur: NEGATIVE mg/dL
Leukocytes,Ua: NEGATIVE
Nitrite: NEGATIVE
Protein, ur: NEGATIVE mg/dL
Specific Gravity, Urine: 1.02 (ref 1.005–1.030)
pH: 6 (ref 5.0–8.0)

## 2021-07-24 LAB — COMPREHENSIVE METABOLIC PANEL
ALT: 28 U/L (ref 0–44)
AST: 21 U/L (ref 15–41)
Albumin: 4.2 g/dL (ref 3.5–5.0)
Alkaline Phosphatase: 48 U/L (ref 38–126)
Anion gap: 12 (ref 5–15)
BUN: 11 mg/dL (ref 6–20)
CO2: 24 mmol/L (ref 22–32)
Calcium: 9 mg/dL (ref 8.9–10.3)
Chloride: 100 mmol/L (ref 98–111)
Creatinine, Ser: 1.04 mg/dL (ref 0.61–1.24)
GFR, Estimated: >60 mL/min (ref >60)
Glucose, Bld: 151 mg/dL — ABNORMAL HIGH (ref 70–99)
Potassium: 4 mmol/L (ref 3.5–5.1)
Sodium: 136 mmol/L (ref 135–145)
Total Bilirubin: 0.9 mg/dL (ref 0.3–1.2)
Total Protein: 7.7 g/dL (ref 6.5–8.1)

## 2021-07-24 LAB — RESP PANEL BY RT-PCR (FLU A&B, COVID) ARPGX2
Influenza A by PCR: NEGATIVE
Influenza B by PCR: NEGATIVE
SARS Coronavirus 2 by RT PCR: POSITIVE — AB

## 2021-07-24 MED ORDER — MORPHINE SULFATE (PF) 4 MG/ML IV SOLN
4.0000 mg | Freq: Once | INTRAVENOUS | Status: AC
Start: 2021-07-24 — End: 2021-07-24
  Administered 2021-07-24: 4 mg via INTRAVENOUS
  Filled 2021-07-24: qty 1

## 2021-07-24 MED ORDER — HYDROCODONE-ACETAMINOPHEN 5-325 MG PO TABS: 1.0000 | ORAL_TABLET | Freq: Four times a day (QID) | ORAL | 0 refills | Status: DC | PRN

## 2021-07-24 MED ORDER — AMOXICILLIN-POT CLAVULANATE 875-125 MG PO TABS
1.0000 | ORAL_TABLET | Freq: Once | ORAL | Status: AC
  Administered 2021-07-24: 1 via ORAL
  Filled 2021-07-24: qty 1

## 2021-07-24 MED ORDER — ONDANSETRON 4 MG PO TBDP: 4.0000 mg | ORAL_TABLET | Freq: Three times a day (TID) | ORAL | 0 refills | Status: DC | PRN

## 2021-07-24 MED ORDER — IOHEXOL 350 MG/ML SOLN
100.0000 mL | Freq: Once | INTRAVENOUS | Status: AC | PRN
  Administered 2021-07-24: 80 mL via INTRAVENOUS

## 2021-07-24 MED ORDER — ONDANSETRON HCL 4 MG/2ML IJ SOLN
4.0000 mg | Freq: Once | INTRAMUSCULAR | Status: AC
  Administered 2021-07-24: 4 mg via INTRAVENOUS
  Filled 2021-07-24: qty 2

## 2021-07-24 MED ORDER — AMOXICILLIN-POT CLAVULANATE 875-125 MG PO TABS: 1.0000 | ORAL_TABLET | Freq: Two times a day (BID) | ORAL | 0 refills | Status: AC

## 2021-07-24 MED ORDER — AMOXICILLIN-POT CLAVULANATE 875-125 MG PO TABS: 1.0000 | ORAL_TABLET | Freq: Two times a day (BID) | ORAL | 0 refills | Status: DC

## 2021-07-24 MED ORDER — HYDROCODONE-ACETAMINOPHEN 5-325 MG PO TABS: 1.0000 | ORAL_TABLET | Freq: Four times a day (QID) | ORAL | 0 refills | Status: AC | PRN

## 2021-07-24 NOTE — Discharge Instructions (Signed)
Take antibiotics as prescribed.  Take entire course, even if symptoms improve. You Zofran as needed for nausea or vomiting. Use Tylenol ibuprofen as needed for mild to moderate pain.  Use Norco as needed for severe breakthrough pain.  Have caution, this make you tired or groggy.  Do not drive or operate machinery while taking this medicine. Return to the emergency room if you develop high fevers, persistent vomiting, severe worsening pain, or as needed for further evaluation.  If you finish your antibiotics and you continue to have symptoms of pain, you should return to the ER for further evaluation. Follow-up with your primary care doctor and/or your GI doctor as needed for further evaluation. Return to the emergency room with any new, worsening, concerning symptoms

## 2021-07-24 NOTE — ED Triage Notes (Signed)
Pt states that he is having a diverticulitis flare up. Pt reports having Covid last week and had diarrhea the whole time which he thinks caused the flare up.

## 2021-07-24 NOTE — ED Provider Notes (Signed)
[facility] DEPT Provider Note   CSN: [id number] Arrival date & time: 07/24/21  0331     History Chief Complaint  Patient presents with   Diverticulitis    KEY CEN is a 60 y.o. male presenting for evaluation of abdominal pain.  Patient states he was diagnosed with COVID last week, has had diarrhea for the past 7 days.  Last night he had significant worsening abdominal pain which feels consistent with his history of diverticulitis.  He reports specks of blood in his stool.  He states pain is localized to the left lower quadrant abdomen, which is consistent with his diverticulitis flares.  He denies fevers, chills.  He denies nausea, but does report some "mild vomiting."  He denies abnormal urination.  No history of kidney stones.  He has not taken anything for pain including Tylenol or ibuprofen.  Food made his symptoms worse yesterday, he has not had any today.  No previous abdominal surgeries.  He follows with a [facility].   HPI     Past Medical History:  Diagnosis Date   Adult ADHD    Not currently being treated   Allergy    Bilateral edema of lower extremity    Wears support stockings.   Bipolar affective disorder in full remission ([place])    Chronic allergic rhinitis    Diverticulosis of intestine    With intermittent diverticulitis   Essential hypertension    GERD (gastroesophageal reflux disease)    Hyperlipidemia due to dietary fat intake    IBS (irritable bowel syndrome)    OSA (obstructive sleep apnea)    Has been intolerant of facemask, is looking into nasal pillow   Prediabetes     Patient Active Problem List   Diagnosis Date Noted   Essential hypertension 12/14/2020   RBBB 12/14/2020   Morbid obesity with BMI of 50.0-59.9, adult ([place]) 62/01/5596   Metabolic syndrome 41/63/8453   Nonspecific abnormal electrocardiogram (ECG) (EKG) 12/14/2020   OSA (obstructive sleep apnea) 12/14/2020   Leiomyoma 09/24/2018    Past  Surgical History:  Procedure Laterality Date   APPENDECTOMY     COLONOSCOPY     Leimyoma     NASAL SINUS SURGERY     x3   NASAL TURBINATE REDUCTION     RHINOPLASTY     right knee surgery         Family History  Problem Relation Age of Onset   Colon cancer Neg Hx    Esophageal cancer Neg Hx    Rectal cancer Neg Hx    Stomach cancer Neg Hx     Social History   Tobacco Use   Smoking status: Former    Types: Cigarettes    Quit date: 07/23/2012    Years since quitting: 9.0   Smokeless tobacco: Never   Tobacco comments:    Was an off-and-on smoker for 10 years.  Did not smoke much.  Vaping Use   Vaping Use: Former  Substance Use Topics   Alcohol use: Yes    Comment: occasionally   Drug use: No    Home Medications Prior to Admission medications   Medication Sig Start Date End Date Taking? Authorizing Provider  albuterol (VENTOLIN HFA) 108 (90 Base) MCG/ACT inhaler as needed. 07/03/20   [provider]  amoxicillin-clavulanate (AUGMENTIN) 875-125 MG tablet Take 1 tablet by mouth every 12 (twelve) hours for 10 days. 07/24/21 08/03/21 Yes Jerolyn Flenniken, PA-C  Cholecalciferol (VITAMIN D PO) Take by mouth daily.  [provider]  gabapentin (NEURONTIN) 600 MG tablet Take 600 mg by mouth at bedtime. 11/28/20   [provider]  hydrochlorothiazide (HYDRODIURIL) 12.5 MG tablet Take 1 tablet (12.5 mg total) by mouth every morning. 02/22/21   Leonie Man, MD  HYDROcodone-acetaminophen (NORCO/VICODIN) 5-325 MG tablet Take 1 tablet by mouth every 6 (six) hours as needed. 07/24/21  Yes Adalaide Jaskolski, PA-C  ketoconazole (NIZORAL) 2 % cream Use as directed. 12/15/18   [provider]  ondansetron (ZOFRAN ODT) 4 MG disintegrating tablet Take 1 tablet (4 mg total) by mouth every 8 (eight) hours as needed for nausea or vomiting. 07/24/21  Yes Demarco Bacci, PA-C  tacrolimus (PROTOPIC) 0.1 % ointment Apply 1 application topically as needed.  08/12/20   [provider]  terbinafine (LAMISIL) 250 MG tablet Take 250 mg by mouth daily. 05/06/21   [provider]  valsartan (DIOVAN) 320 MG tablet Take 320 mg by mouth daily. 02/18/21   [provider]    Allergies    Patient has no known allergies.  Review of Systems   Review of Systems  Gastrointestinal:  Positive for abdominal pain and vomiting.  All other systems reviewed and are negative.  Physical Exam Updated Vital Signs BP (!) 156/90   Pulse 87   Temp 98.6 F (37 C) (Oral)   Resp (!) 25   Ht 5\' 6"  (1.676 m)   Wt 131.5 kg   SpO2 95%   BMI 46.81 kg/m   Physical Exam Vitals and nursing note reviewed.  Constitutional:      General: He is not in acute distress.    Appearance: Normal appearance.     Comments: Appears uncomfortable due to pain, nontoxic  HENT:     Head: Normocephalic and atraumatic.  Eyes:     Extraocular Movements: Extraocular movements intact.     Conjunctiva/sclera: Conjunctivae normal.     Pupils: Pupils are equal, round, and reactive to light.  Cardiovascular:     Rate and Rhythm: Normal rate and regular rhythm.     Pulses: Normal pulses.  Pulmonary:     Effort: Pulmonary effort is normal. No respiratory distress.     Breath sounds: Normal breath sounds. No wheezing.     Comments: Speaking in full sentences.  Clear lung sounds in all fields. Abdominal:     General: There is no distension.     Palpations: Abdomen is soft. There is no mass.     Tenderness: There is abdominal tenderness. There is no guarding or rebound.     Comments: Protuberant abdomen.  Tenderness palpation of the left lower quadrant.  No rigidity or guarding.  No CVA tenderness.  Musculoskeletal:        General: Normal range of motion.     Cervical back: Normal range of motion and neck supple.     Right lower leg: No edema.     Left lower leg: No edema.  Skin:    General: Skin is warm and dry.     Capillary Refill: Capillary refill takes  less than 2 seconds.  Neurological:     Mental Status: He is alert and oriented to person, place, and time.  Psychiatric:        Mood and Affect: Mood and affect normal.        Speech: Speech normal.        Behavior: Behavior normal.    ED Results / Procedures / Treatments   Labs (all labs ordered are listed,  but only abnormal results are displayed) Labs Reviewed  RESP PANEL BY RT-PCR (FLU A&B, COVID) ARPGX2 - Abnormal; Notable for the following components:      Result Value   SARS Coronavirus 2 by RT PCR POSITIVE (*)    All other components within normal limits  CBC WITH DIFFERENTIAL/PLATELET - Abnormal; Notable for the following components:   WBC 11.4 (*)    Neutro Abs 8.8 (*)    All other components within normal limits  COMPREHENSIVE METABOLIC PANEL - Abnormal; Notable for the following components:   Glucose, Bld 151 (*)    All other components within normal limits  URINALYSIS, ROUTINE W REFLEX MICROSCOPIC    EKG None  Radiology CT ABDOMEN PELVIS W CONTRAST  Result Date: 07/24/2021 CLINICAL DATA:  Diverticulosis. Lower abdominal pain. Suspect diverticulitis. Recent episode of COVID. Diarrhea. EXAM: CT ABDOMEN AND PELVIS WITH CONTRAST TECHNIQUE: Multidetector CT imaging of the abdomen and pelvis was performed using the standard protocol following bolus administration of intravenous contrast. CONTRAST:  23mL OMNIPAQUE IOHEXOL 350 MG/ML SOLN COMPARISON:  CT pelvis 05/08/17 FINDINGS: Lower chest: Mild dependent atelectasis present bases bilaterally. Heart size normal. No significant pleural or pericardial effusion is present. Hepatobiliary: Diffuse fatty infiltration of liver is progressed. Bile duct and gallbladder normal. No focal hepatic lesions are present. Pancreas: Unremarkable. No pancreatic ductal dilatation or surrounding inflammatory changes. Spleen: Normal in size without focal abnormality. Adrenals/Urinary Tract: Adrenal glands are unremarkable. Kidneys are normal, without  renal calculi, focal lesion, or hydronephrosis. Bladder is unremarkable. Stomach/Bowel: Stomach duodenum are within limits. Bowel is unremarkable terminal ileum is within normal limits. Appendix is not discretely visualized and may be surgically absent. Ascending transverse colon are within normal limits. Diverticular changes present distal descending colon. Marked inflammatory changes are present about sigmoid colon. No significant free air or free fluid is present. Rectum is within limits. Vascular/Lymphatic: Minimal atherosclerotic calcifications present. No aneurysm is present. Benign-appearing iliac are present bilaterally, greater than right. Reproductive: Prostate is unremarkable. Other: Marked stranding is present about the sigmoid colon. Minimal interstitial FLAIR is present without discrete fluid collection. Significant ventral hernia is present. Musculoskeletal: Degenerative changes are again seen the lumbar spine. Slight retrolisthesis at L4-5 is stable. Endplate sclerotic changes L5-S1. Focal osseous. Bony pelvis within normal limits. Located and within limits. IMPRESSION: 1. Sigmoid diverticulitis without evidence for abscess or perforation. Marked surrounding edema noted. 2. Progressive fatty infiltration of the liver. No discrete lesions. 3. Aortic Atherosclerosis (ICD10-I70.0). Electronically Signed   By: San Morelle M.D.   On: 07/24/2021 11:25    Procedures Procedures   Medications Ordered in ED Medications  amoxicillin-clavulanate (AUGMENTIN) 875-125 MG per tablet 1 tablet (has no administration in time range)  morphine 4 MG/ML injection 4 mg (4 mg Intravenous Given 07/24/21 0756)  ondansetron (ZOFRAN) injection 4 mg (4 mg Intravenous Given 07/24/21 0755)  iohexol (OMNIPAQUE) 350 MG/ML injection 100 mL (80 mLs Intravenous Contrast Given 07/24/21 1044)  morphine 4 MG/ML injection 4 mg (4 mg Intravenous Given 07/24/21 1131)    ED Course  I have reviewed the triage vital signs and  the nursing notes.  Pertinent labs & imaging results that were available during my care of the patient were reviewed by me and considered in my medical decision making (see chart for details).    MDM Rules/Calculators/A&P                           Patient presenting for evaluation of  abdominal pain and diarrhea.  On exam, patient appears uncomfortable due to pain, but otherwise nontoxic.  He does have tenderness palpation of left lower quadrant on exam.  Very protuberant abdomen.  Labs obtained in triage show mild leukocytosis of 11.  Of note, patient is also COVID-positive, however he was diagnosed a week ago and is afebrile today, doubt acute phase.  Will obtain CT to assess for diverticulitis.  Consider other causes such as kidney stone, gastroenteritis, UTI.  CT consistent with acute diverticulitis.  There is stranding but no evidence of microperforation or abscess.  On reevaluation, patient states pain is improved after medication.  Discussed that due to the amount of inflammation, could consider observation in the hospital to ensure improvement versus discharge and close monitoring at home.  Patient would like to go home and treat this with antibiotics.  Discussed strict return precautions.  Encourage follow-up with PCP/GI.  At this time, patient appears safe for discharge.  Return precautions given.  Patient states he understands and agrees to plan  Final Clinical Impression(s) / ED Diagnoses Final diagnoses:  Diverticulitis    Rx / DC Orders ED Discharge Orders          Ordered    amoxicillin-clavulanate (AUGMENTIN) 875-125 MG tablet  Every 12 hours        07/24/21 1211    ondansetron (ZOFRAN ODT) 4 MG disintegrating tablet  Every 8 hours PRN        07/24/21 1211    HYDROcodone-acetaminophen (NORCO/VICODIN) 5-325 MG tablet  Every 6 hours PRN        07/24/21 1211             Dakayla Disanti, PA-C 07/24/21 1215    Wyvonnia Dusky, MD 07/24/21 1701

## 2021-07-24 NOTE — ED Notes (Signed)
Pt asked if he had transportation home d/t receiving 8 mg total of morphine.  Pt reports he does have a ride however declines waiting for ride in his room. Suspicion that he may not have a ride reported to [place], [place] who stated she would prefer him to wait in the room until ride arrives. Pt insists on waiting outside. Will inform Sophia, [place].

## 2021-07-31 DIAGNOSIS — G4733 Obstructive sleep apnea (adult) (pediatric): Secondary | ICD-10-CM | POA: Diagnosis not present

## 2021-08-05 ENCOUNTER — Ambulatory Visit: Payer: BC Managed Care – PPO | Admitting: Cardiology

## 2021-08-12 ENCOUNTER — Encounter: Payer: Self-pay | Admitting: Cardiology

## 2021-08-12 NOTE — Progress Notes (Signed)
Primary Care Provider: Shirline Frees, MD Cardiologist: Glenetta Hew, MD Electrophysiologist: None  Clinic Note: Chief Complaint  Patient presents with   Follow-up    6 months.  Significant weight loss-33 pounds since initial visit   Hypertension    Well-controlled.     ===================================  ASSESSMENT/PLAN   Problem List Items Addressed This Visit       Cardiology Problems   Essential hypertension (Chronic)    Blood pressure is pretty well controlled he is now on combination valsartan-HCTZ, blood pressure medicine.  Unfortunately the pharmacy did not have accommodation medication for a while and so he was taking this separate presents with HCTZ and valsartan.  He has a lot of extra left so he will continue to take the medication separately until he runs out and then we have refilled his combination Diovan-HCTZ.      Relevant Medications   valsartan-hydrochlorothiazide (DIOVAN-HCT) 320-25 MG tablet   hydrochlorothiazide (MICROZIDE) 12.5 MG capsule   RBBB (Chronic)    Normal echo.      Relevant Medications   valsartan-hydrochlorothiazide (DIOVAN-HCT) 320-25 MG tablet   hydrochlorothiazide (MICROZIDE) 12.5 MG capsule   Other Relevant Orders   EKG 12-Lead (Completed)     Other   Morbid obesity with BMI of 40.0-44.9, adult (HCC) (Chronic)    Although not intentional, he he lost 33 pounds in the last 8 months.  I congratulated him on the weight loss, and encouraged him to continue to work on diet and exercise to lose more weight.  He does feel better overall with weight loss.      Relevant Orders   CT CARDIAC SCORING (SELF PAY ONLY) (Completed)   Metabolic syndrome - Primary (Chronic)    Obesity, hyper triglyceridemia, hypertension and prediabetes.  Hopefully with weight loss, triglycerides and insulin resistance will improve.  Blood pressure is well controlled.  Since his LDL is pretty controlled, he is not actually on a statin. Plan: We will start  fish oil titrating up to 3 g a day.  Will check coronary calcium score for baseline risk ratification.      Relevant Orders   CT CARDIAC SCORING (SELF PAY ONLY) (Completed)   OSA (obstructive sleep apnea) (Chronic)    No evidence of pulm hypertension on echo.  Discussed importance of using CPAP.       ===================================  HPI:    Brian Gonzalez is a 60 y.o. male with a PMH notable for Metabolic Syndrome (morbid obesity, HTN, impaired FBG, Hyperlipidemia, prediabetes) with RBBB, OSA (not on CPAP as of February 2022) who presents today for 38-month follow-up.  Brian Gonzalez was seen for initial consultation on February 2022.  - checked 2D Echo (TTE).  We discussed further evaluation with either coronary CTA or simply coronary calcium score based on results of echo.  At that time he weighed 215 pounds.  He was seen by Jory Sims, NP on February 22, 2021 to discuss results of his echocardiogram.  He did note poor exercise tolerance.  Intermittent IBS symptoms. ->  PCP had increased valsartan from 160-320mg  along with HCTZ 12.5 mg daily.  Had started back on CPAP that was on automatic adjustment.  Unfortunately he did acknowledge poor dietary habits with eating fast food and remaining sedentary.  Job had him traveling frequently.  Despite this, his weight was down 11 pounds.  Recent Hospitalizations:  ER visit 07/24/2021 for diverticulitis. -- lost a lot of wgt; ~ E coli triggered it, complication of  COVID 9/5 -  ended up being 3 wks off.   Reviewed  CV studies:    The following studies were reviewed today: (if available, images/films reviewed: From Epic Chart or Care Everywhere) TTE 12/31/2020: EF 60 to 65%.  No R WMA.  Mild concentric LVH.  GRII DD.  Mild LA dilation.  Mildly reduced RV function but unable assess RVP 2/2  Poor TR jet-unable to really assess RVP.Brian Gonzalez  Also normal valves.  Ascending aorta measured 41 mm.   Interval History:   Brian Gonzalez presents here  today for follow-up stating that somewhat unintentionally, as a result of diverticulitis in his colon lost 33 total health since his visit with me back in February.  He is also cut back on CPAP, just noticed his heartbeat when he is traveling.  He now has more time at home though and is able to use his CPAP more frequently.  His job is changed so he is now home more frequently.  He also has had more contact some exercise.  He still has some exertional dyspnea due to obesity doing much better with weight loss.  He is starting get his energy back after his recent illnesses.  Edema is well controlled with standing dose of Lasix.  He is doing really well [place] standpoint with no active cardiac symptoms of chest pain pressure or dyspnea with rest or exertion-he does have a little deconditioning related dyspnea, but doing better with this recent weight loss.  No real orthopnea.  No heart racing skipping symptoms.  No syncope or near syncope  REVIEWED OF SYSTEMS   Review of Systems  Constitutional:  Positive for malaise/fatigue (Energy level is better, but he is still somewhat tired mostly related to his recent illness.) and weight loss (33 pound since last visit with me.  20 pound since April visit with NP.).  HENT:  Negative for congestion and nosebleeds.   Respiratory:  Negative for cough and shortness of breath.   Cardiovascular:  Negative for claudication.  Gastrointestinal:  Negative for abdominal pain (Abdominal pain is resolved.) and blood in stool (No longer noting blood in the stool -since recent treatment for diverticulitis.Brian Gonzalez).       He still has occasional IBS symptoms, pain and this is improved since treatment of his diverticulitis.  Genitourinary:  Negative for hematuria.  Musculoskeletal:  Positive for joint pain (Bilateral knees). Negative for back pain.  Neurological:  Negative for dizziness.  Psychiatric/Behavioral:  Negative for depression and memory loss. The patient is not  nervous/anxious and does not have insomnia.    I have reviewed and (if needed) personally updated the patient's problem list, medications, allergies, past medical and surgical history, social and family history.   PAST MEDICAL HISTORY   Past Medical History:  Diagnosis Date   Adult ADHD    Not currently being treated   Allergy    Bilateral edema of lower extremity    Wears support stockings.   Bipolar affective disorder in full remission ([place])    Chronic allergic rhinitis    Diverticulosis of intestine    With intermittent diverticulitis   Essential hypertension    GERD (gastroesophageal reflux disease)    Hyperlipidemia due to dietary fat intake    IBS (irritable bowel syndrome)    OSA (obstructive sleep apnea)    Has been intolerant of facemask, is looking into nasal pillow   Prediabetes     PAST SURGICAL HISTORY   Past Surgical History:  Procedure Laterality Date   APPENDECTOMY  COLONOSCOPY     [place]     NASAL SINUS SURGERY     x3   NASAL TURBINATE REDUCTION     RHINOPLASTY     right knee surgery     TRANSTHORACIC ECHOCARDIOGRAM  12/31/2020   EF 60 to 65%.  No R WMA.  Mild concentric LVH.  GRII DD.  Mild LA dilation.  Mildly reduced RV function but unable assess RVP 2/2  Poor TR jet.  Also normal valves.  SM aorta measured 41 mm.    Immunization History  Administered Date(s) Administered   PFIZER(Purple Top)SARS-COV-2 Vaccination 03/20/2020, 04/14/2020    MEDICATIONS/ALLERGIES   Current Meds  Medication Sig   albuterol (VENTOLIN HFA) 108 (90 Base) MCG/ACT inhaler as needed.   Cholecalciferol (VITAMIN D PO) Take by mouth daily.   gabapentin (NEURONTIN) 600 MG tablet Take 600 mg by mouth at bedtime.   hydrochlorothiazide (MICROZIDE) 12.5 MG capsule Take 1 capsule (12.5 mg total) by mouth daily.   HYDROcodone-acetaminophen (NORCO/VICODIN) 5-325 MG tablet Take 1 tablet by mouth every 6 (six) hours as needed.   ketoconazole (NIZORAL) 2 % cream Use as  directed.   Omega-3 Fatty Acids (FISH OIL) 1000 MG CAPS Take 1 capsule (1,000 mg total) by mouth 3 (three) times daily.   tacrolimus (PROTOPIC) 0.1 % ointment Apply 1 application topically as needed.   valsartan-hydrochlorothiazide (DIOVAN-HCT) 320-25 MG tablet Take 1 tablet by mouth daily.   Current Facility-Administered Medications for the 08/13/21 encounter (Office Visit) with Leonie Man, MD  Medication   0.9 %  sodium chloride infusion    No Known Allergies  SOCIAL HISTORY/FAMILY HISTORY   Reviewed in Epic:  Pertinent findings:  Social History   Tobacco Use   Smoking status: Former    Types: Cigarettes    Quit date: 07/23/2012    Years since quitting: 9.1   Smokeless tobacco: Never   Tobacco comments:    Was an off-and-on smoker for 10 years.  Did not smoke much.  Vaping Use   Vaping Use: Former  Substance Use Topics   Alcohol use: Yes    Comment: occasionally   Drug use: No   Social History   Social History Narrative   He is a divorced father of 19 daughters-1 daughter divorced, one married and one single.  He has 3 grandchildren.   Drinks 1 cup of coffee every morning.   Usually drinks maybe 1 shot of whiskey or a glass of wine a day.  Does not like beer because it makes him bloated.   He tries to walk daily, but his favorite form of exercise is kayaking.    OBJCTIVE -PE, EKG, labs   Wt Readings from Last 3 Encounters:  08/13/21 282 lb 9.6 oz (128.2 kg)  07/24/21 290 lb (131.5 kg)  02/22/21 (!) 304 lb 3.2 oz (138 kg)    Physical Exam: BP 128/68   Pulse 76   Ht 5\' 6"  (1.676 m)   Wt 282 lb 9.6 oz (128.2 kg)   SpO2 96%   BMI 45.61 kg/m  Physical Exam Vitals reviewed.  Constitutional:      General: He is not in acute distress.    Appearance: Normal appearance. He is obese. He is not ill-appearing or toxic-appearing.     Comments: Remains well morbidly obese, but notably less weight since last visit.  Well-groomed.  Not as diaphoretic.  HENT:      Head: Normocephalic and atraumatic.  Neck:     Vascular: No carotid  bruit or JVD.  Cardiovascular:     Rate and Rhythm: Normal rate and regular rhythm. Occasional Extrasystoles are present.    Chest Wall: PMI is not displaced (Unable to palpate).     Pulses: Intact distal pulses.     Heart sounds: Heart sounds are distant. No murmur heard.   No friction rub. No gallop.     Comments: Normal S1 and split S2. Pulmonary:     Effort: Pulmonary effort is normal. No respiratory distress.     Breath sounds: Normal breath sounds.  Chest:     Chest wall: No tenderness.  Musculoskeletal:        General: Swelling (Trivial) present. Normal range of motion.     Cervical back: Normal range of motion and neck supple.  Skin:    General: Skin is warm and dry.     Coloration: Pallor: 6.  Neurological:     General: No focal deficit present.     Mental Status: He is alert and oriented to person, place, and time.     Gait: Gait abnormal.  Psychiatric:        Mood and Affect: Mood normal.        Behavior: Behavior normal.        Thought Content: Thought content normal.        Judgment: Judgment normal.       Adult ECG Report  Rate: 76 ;  Rhythm: normal sinus rhythm; RBBB.  Otherwise normal axis, intervals & durations  Narrative Interpretation: Stable  Recent Labs:   04/11/2021: TC 127, TG 225, HDL 35, LDL 56.  A1c 5.9.    Lab Results  Component Value Date   CREATININE 1.04 07/24/2021   BUN 11 07/24/2021   NA 136 07/24/2021   K 4.0 07/24/2021   CL 100 07/24/2021   CO2 24 07/24/2021   CBC Latest Ref Rng & Units 07/24/2021  WBC 4.0 - 10.5 K/uL 11.4(H)  Hemoglobin 13.0 - 17.0 g/dL 16.3  Hematocrit 39.0 - 52.0 % 48.0  Platelets 150 - 400 K/uL 182    No results found for: HGBA1C No results found for: TSH  ==================================================  COVID-19 Education: The signs and symptoms of COVID-19 were discussed with the patient and how to seek care for testing (follow up  with PCP or arrange E-visit).    I spent a total of 25 minutes with the patient spent in direct patient consultation.  Additional time spent with chart review  / charting (studies, outside notes, etc): 20 min Total Time: 45 min  Current medicines are reviewed at length with the patient today.  (+/- concerns) none  This visit occurred during the SARS-CoV-2 public health emergency.  Safety protocols were in place, including screening questions prior to the visit, additional usage of staff PPE, and extensive cleaning of exam room while observing appropriate contact time as indicated for disinfecting solutions.  Notice: This dictation was prepared with Dragon dictation along with smart phrase technology. Any transcriptional errors that result from this process are unintentional and may not be corrected upon review.  Patient Instructions / Medication Changes & Studies & Tests Ordered   Patient Instructions  Medication Instructions:   Refilled hydrochlorothiazide 12.5 mg  - take with your Diovan 320 mg   once complete switch back to the combination Diovan /HCT   Take fish oil ( over the counter)- 100 mg   three times a day or may take them all at one time    *If you need  a refill on your cardiac medications before your next appointment, please call your pharmacy*   Lab Work: Not needed   Testing/Procedures:  CT coronary calcium score. This test is done at [address]. This is $99 out of pocket.   Coronary CalciumScan A coronary calcium scan is an imaging test used to look for deposits of calcium and other fatty materials (plaques) in the inner lining of the blood vessels of the heart (coronary arteries). These deposits of calcium and plaques can partly clog and narrow the coronary arteries without producing any symptoms or warning signs. This puts a person at risk for a heart attack. This test can detect these deposits before symptoms develop. Tell a health care  provider about: Any allergies you have. All medicines you are taking, including vitamins, herbs, eye drops, creams, and over-the-counter medicines. Any problems you or family members have had with anesthetic medicines. Any blood disorders you have. Any surgeries you have had. Any medical conditions you have. Whether you are pregnant or may be pregnant. What are the risks? Generally, this is a safe procedure. However, problems may occur, including: Harm to a pregnant woman and her unborn baby. This test involves the use of radiation. Radiation exposure can be dangerous to a pregnant woman and her unborn baby. If you are pregnant, you generally should not have this procedure done. Slight increase in the risk of cancer. This is because of the radiation involved in the test. What happens before the procedure? No preparation is needed for this procedure. What happens during the procedure? You will undress and remove any jewelry around your neck or chest. You will put on a hospital gown. Sticky electrodes will be placed on your chest. The electrodes will be connected to an electrocardiogram (ECG) machine to record a tracing of the electrical activity of your heart. A CT scanner will take pictures of your heart. During this time, you will be asked to lie still and hold your breath for 2-3 seconds while a picture of your heart is being taken. The procedure may vary among health care providers and hospitals. What happens after the procedure? You can get dressed. You can return to your normal activities. It is up to you to get the results of your test. Ask your health care provider, or the department that is doing the test, when your results will be ready. Summary A coronary calcium scan is an imaging test used to look for deposits of calcium and other fatty materials (plaques) in the inner lining of the blood vessels of the heart (coronary arteries). Generally, this is a safe procedure. Tell your  health care provider if you are pregnant or may be pregnant. No preparation is needed for this procedure. A CT scanner will take pictures of your heart. You can return to your normal activities after the scan is done. This information is not intended to replace advice given to you by your health care provider. Make sure you discuss any questions you have with your health care provider. Document Released: 04/17/2008 Document Revised: 09/08/2016 Document Reviewed: 09/08/2016 Elsevier Interactive Patient Education  2017 [place]: At [facility], you and your health needs are our priority.  As part of our continuing mission to provide you with exceptional heart care, we have created designated Provider Care Teams.  These Care Teams include your primary Cardiologist (physician) and Advanced Practice Providers (APPs -  Physician Assistants and Nurse Practitioners) who all work together to  provide you with the care you need, when you need it.     Your next appointment:   6 month(s)  The format for your next appointment:   In Person  Provider:   Glenetta Hew, MD     Studies Ordered:   Orders Placed This Encounter  Procedures   CT CARDIAC SCORING (SELF PAY ONLY)   EKG 12-Lead      Glenetta Hew, M.D., M.S. Interventional Cardiologist   Pager # [phone] Phone # [phone] [address] [place], [place] [address]   Thank you for choosing Heartcare at [facility]!!

## 2021-08-13 ENCOUNTER — Ambulatory Visit: Payer: BC Managed Care – PPO | Admitting: Cardiology

## 2021-08-13 ENCOUNTER — Other Ambulatory Visit: Payer: Self-pay

## 2021-08-13 ENCOUNTER — Encounter: Payer: Self-pay | Admitting: Cardiology

## 2021-08-13 VITALS — BP 128/68 | HR 76 | Ht 66.0 in | Wt 282.6 lb

## 2021-08-13 DIAGNOSIS — I10 Essential (primary) hypertension: Secondary | ICD-10-CM | POA: Diagnosis not present

## 2021-08-13 DIAGNOSIS — Z6841 Body Mass Index (BMI) 40.0 and over, adult: Secondary | ICD-10-CM

## 2021-08-13 DIAGNOSIS — I4510 Unspecified right bundle-branch block: Secondary | ICD-10-CM | POA: Diagnosis not present

## 2021-08-13 DIAGNOSIS — E8881 Metabolic syndrome: Secondary | ICD-10-CM

## 2021-08-13 DIAGNOSIS — G4733 Obstructive sleep apnea (adult) (pediatric): Secondary | ICD-10-CM

## 2021-08-13 MED ORDER — HYDROCHLOROTHIAZIDE 12.5 MG PO CAPS: 12.5000 mg | ORAL_CAPSULE | Freq: Every day | ORAL | 1 refills | Status: AC

## 2021-08-13 MED ORDER — FISH OIL 1000 MG PO CAPS: 1000.0000 mg | ORAL_CAPSULE | Freq: Three times a day (TID) | ORAL | 11 refills | Status: AC

## 2021-08-13 NOTE — Patient Instructions (Addendum)
Medication Instructions:   Refilled hydrochlorothiazide 12.5 mg  - take with your Diovan 320 mg   once complete switch back to the combination Diovan /HCT   Take fish oil ( over the counter)- 100 mg   three times a day or may take them all at one time    *If you need a refill on your cardiac medications before your next appointment, please call your pharmacy*   Lab Work: Not needed   Testing/Procedures:  CT coronary calcium score. This test is done at [address]. This is $99 out of pocket.   Coronary CalciumScan A coronary calcium scan is an imaging test used to look for deposits of calcium and other fatty materials (plaques) in the inner lining of the blood vessels of the heart (coronary arteries). These deposits of calcium and plaques can partly clog and narrow the coronary arteries without producing any symptoms or warning signs. This puts a person at risk for a heart attack. This test can detect these deposits before symptoms develop. Tell a health care provider about: Any allergies you have. All medicines you are taking, including vitamins, herbs, eye drops, creams, and over-the-counter medicines. Any problems you or family members have had with anesthetic medicines. Any blood disorders you have. Any surgeries you have had. Any medical conditions you have. Whether you are pregnant or may be pregnant. What are the risks? Generally, this is a safe procedure. However, problems may occur, including: Harm to a pregnant woman and her unborn baby. This test involves the use of radiation. Radiation exposure can be dangerous to a pregnant woman and her unborn baby. If you are pregnant, you generally should not have this procedure done. Slight increase in the risk of cancer. This is because of the radiation involved in the test. What happens before the procedure? No preparation is needed for this procedure. What happens during the procedure? You will undress and  remove any jewelry around your neck or chest. You will put on a hospital gown. Sticky electrodes will be placed on your chest. The electrodes will be connected to an electrocardiogram (ECG) machine to record a tracing of the electrical activity of your heart. A CT scanner will take pictures of your heart. During this time, you will be asked to lie still and hold your breath for 2-3 seconds while a picture of your heart is being taken. The procedure may vary among health care providers and hospitals. What happens after the procedure? You can get dressed. You can return to your normal activities. It is up to you to get the results of your test. Ask your health care provider, or the department that is doing the test, when your results will be ready. Summary A coronary calcium scan is an imaging test used to look for deposits of calcium and other fatty materials (plaques) in the inner lining of the blood vessels of the heart (coronary arteries). Generally, this is a safe procedure. Tell your health care provider if you are pregnant or may be pregnant. No preparation is needed for this procedure. A CT scanner will take pictures of your heart. You can return to your normal activities after the scan is done. This information is not intended to replace advice given to you by your health care provider. Make sure you discuss any questions you have with your health care provider. Document Released: 04/17/2008 Document Revised: 09/08/2016 Document Reviewed: 09/08/2016 Elsevier Interactive Patient Education  2017 [place]: At  [facility], you and your health needs are our priority.  As part of our continuing mission to provide you with exceptional heart care, we have created designated Provider Care Teams.  These Care Teams include your primary Cardiologist (physician) and Advanced Practice Providers (APPs -  Physician Assistants and Nurse Practitioners) who all work together to provide  you with the care you need, when you need it.     Your next appointment:   6 month(s)  The format for your next appointment:   In Person  Provider:   Glenetta Hew, MD

## 2021-08-19 DIAGNOSIS — K5732 Diverticulitis of large intestine without perforation or abscess without bleeding: Secondary | ICD-10-CM | POA: Diagnosis not present

## 2021-08-19 DIAGNOSIS — Z23 Encounter for immunization: Secondary | ICD-10-CM | POA: Diagnosis not present

## 2021-08-30 ENCOUNTER — Inpatient Hospital Stay: Admission: RE | Admit: 2021-08-30 | Payer: Self-pay | Source: Ambulatory Visit

## 2021-08-30 DIAGNOSIS — G4733 Obstructive sleep apnea (adult) (pediatric): Secondary | ICD-10-CM | POA: Diagnosis not present

## 2021-09-06 ENCOUNTER — Ambulatory Visit
Admission: RE | Admit: 2021-09-06 | Discharge: 2021-09-06 | Disposition: A | Discharge status: Home / Self Care | Payer: Self-pay | Source: Ambulatory Visit | Attending: Cardiology | Admitting: Cardiology

## 2021-09-06 ENCOUNTER — Other Ambulatory Visit: Payer: Self-pay

## 2021-09-06 DIAGNOSIS — Z6841 Body Mass Index (BMI) 40.0 and over, adult: Secondary | ICD-10-CM

## 2021-09-06 DIAGNOSIS — E8881 Metabolic syndrome: Secondary | ICD-10-CM

## 2021-09-10 DIAGNOSIS — F3162 Bipolar disorder, current episode mixed, moderate: Secondary | ICD-10-CM | POA: Diagnosis not present

## 2021-09-10 DIAGNOSIS — F6381 Intermittent explosive disorder: Secondary | ICD-10-CM | POA: Diagnosis not present

## 2021-09-10 DIAGNOSIS — G4700 Insomnia, unspecified: Secondary | ICD-10-CM | POA: Diagnosis not present

## 2021-09-11 DIAGNOSIS — R040 Epistaxis: Secondary | ICD-10-CM | POA: Diagnosis not present

## 2021-09-14 ENCOUNTER — Encounter: Payer: Self-pay | Admitting: Cardiology

## 2021-09-14 NOTE — Assessment & Plan Note (Signed)
Obesity, hyper triglyceridemia, hypertension and prediabetes.  Hopefully with weight loss, triglycerides and insulin resistance will improve.  Blood pressure is well controlled.  Since his LDL is pretty controlled, he is not actually on a statin. Plan: We will start fish oil titrating up to 3 g a day.  Will check coronary calcium score for baseline risk ratification.

## 2021-09-14 NOTE — Assessment & Plan Note (Signed)
Normal echo.

## 2021-09-14 NOTE — Assessment & Plan Note (Signed)
No evidence of pulm hypertension on echo.  Discussed importance of using CPAP.

## 2021-09-14 NOTE — Assessment & Plan Note (Signed)
Blood pressure is pretty well controlled he is now on combination valsartan-HCTZ, blood pressure medicine.  Unfortunately the pharmacy did not have accommodation medication for a while and so he was taking this separate presents with HCTZ and valsartan.  He has a lot of extra left so he will continue to take the medication separately until he runs out and then we have refilled his combination Diovan-HCTZ.

## 2021-09-14 NOTE — Assessment & Plan Note (Signed)
Although not intentional, he he lost 33 pounds in the last 8 months.  I congratulated him on the weight loss, and encouraged him to continue to work on diet and exercise to lose more weight.  He does feel better overall with weight loss.

## 2021-09-15 IMAGING — CR DG CHEST 2V
2 series · 2 of 2 positions shown · non-contrast
Comparison: 02/17/2017

CLINICAL DATA: Screening for tuberculosis.

EXAM:
CHEST - 2 VIEW

[w chest pa]
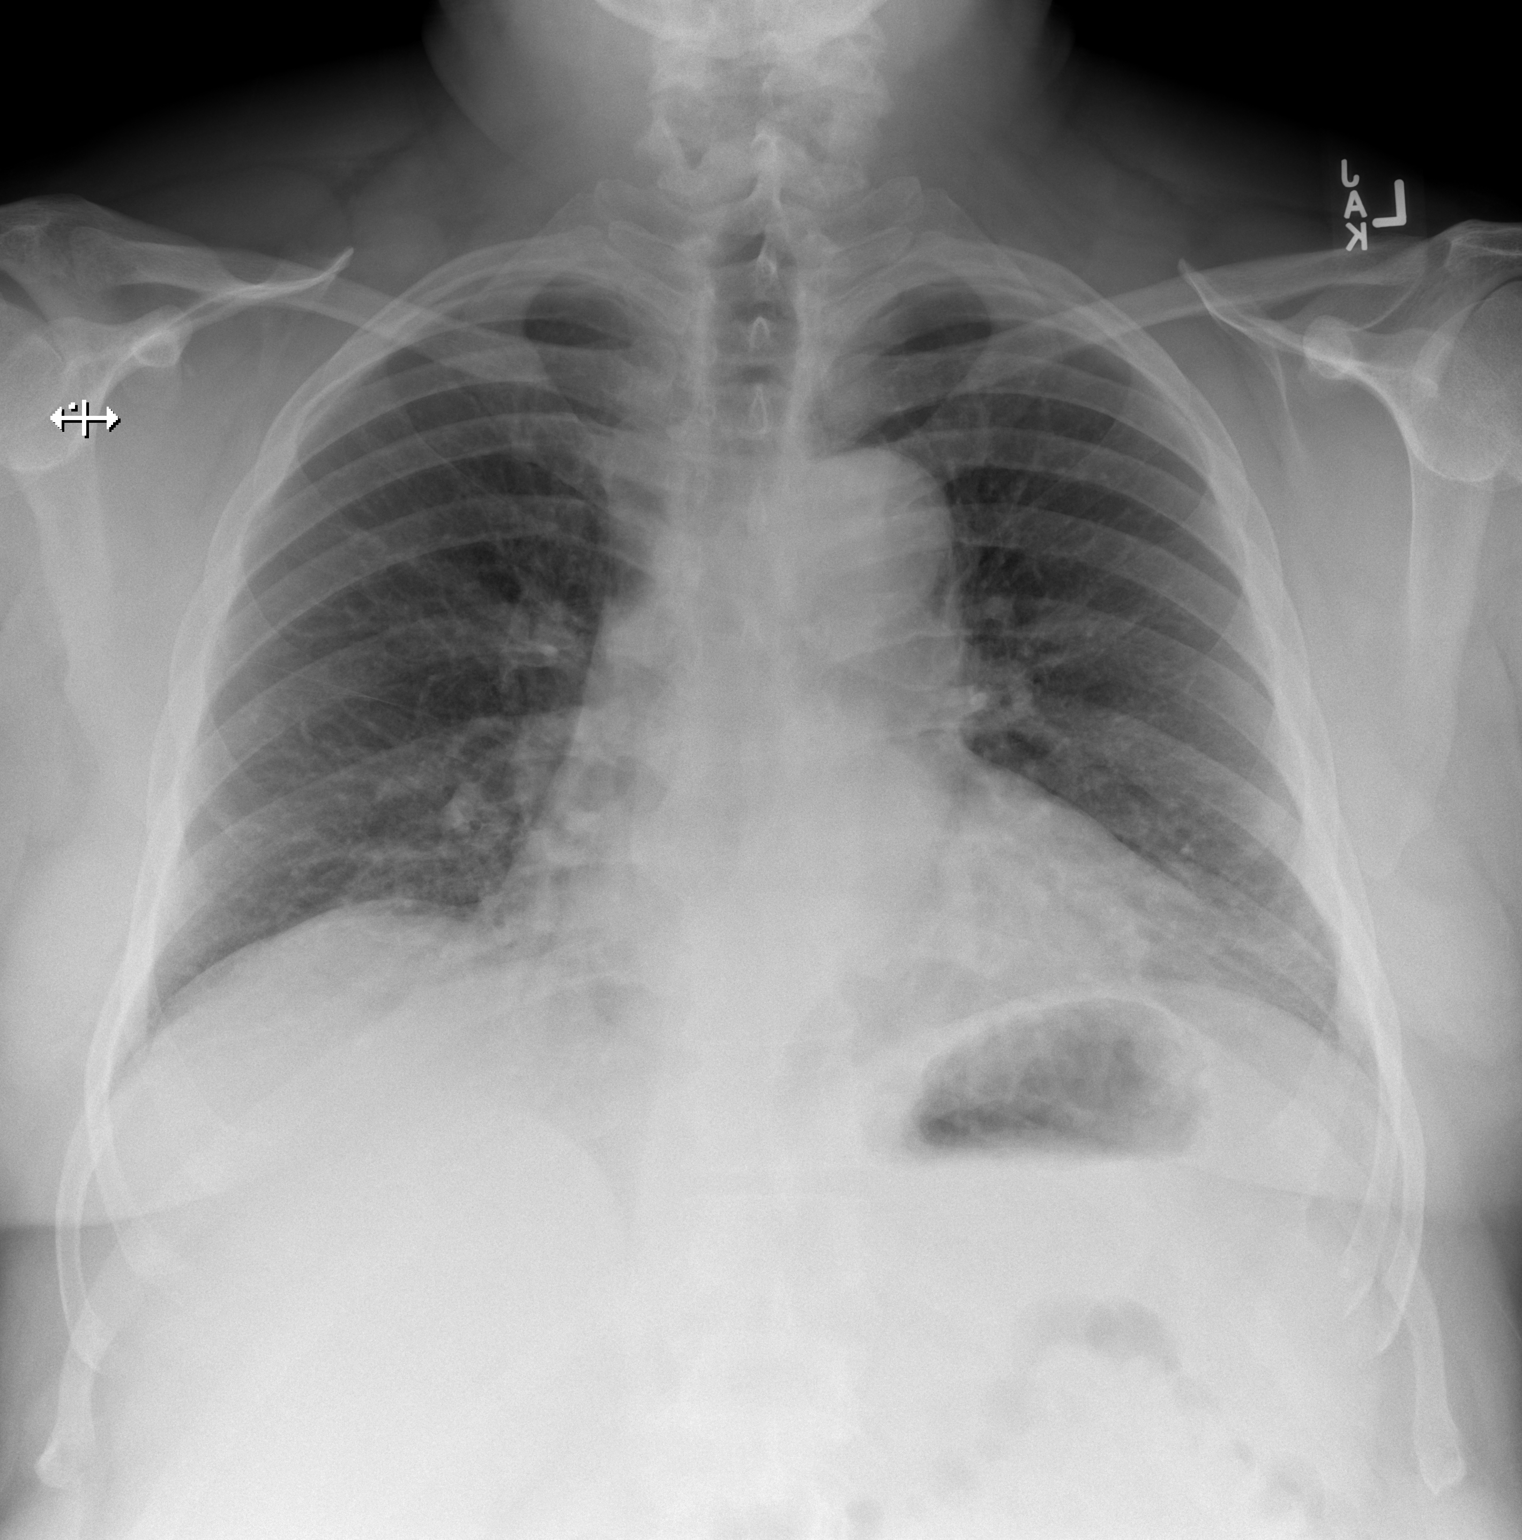

[w chest lat]
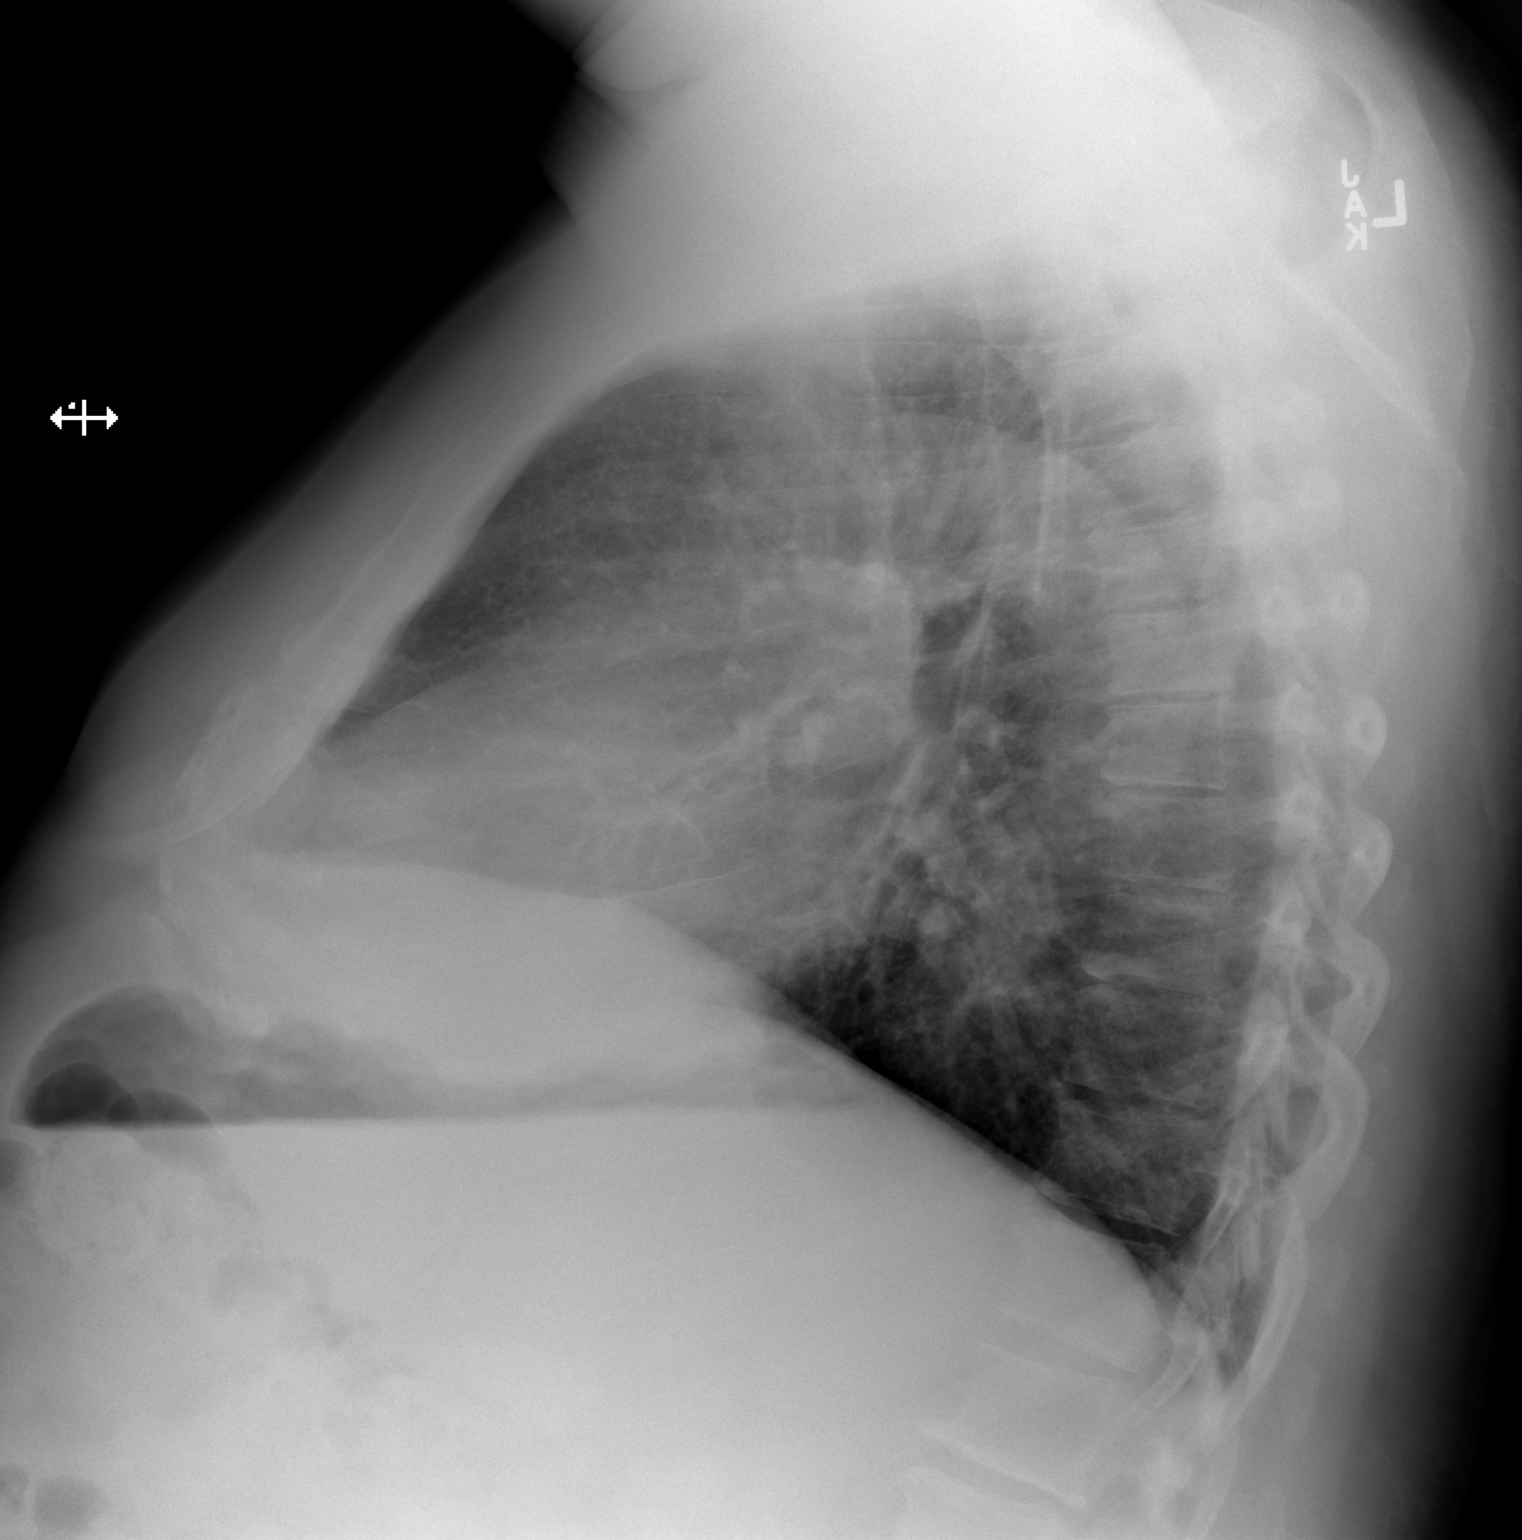

[2 of 2 positions shown; findings below may reference images not displayed]

FINDINGS: The heart size is normal. Mild tortuosity of thoracic aorta is
stable. Low lung volumes again noted. Both lungs are clear. The
visualized skeletal structures are unremarkable.
IMPRESSION: Low lung volumes. No active cardiopulmonary disease.

## 2021-09-30 DIAGNOSIS — G4733 Obstructive sleep apnea (adult) (pediatric): Secondary | ICD-10-CM | POA: Diagnosis not present

## 2021-10-10 DIAGNOSIS — K219 Gastro-esophageal reflux disease without esophagitis: Secondary | ICD-10-CM | POA: Diagnosis not present

## 2021-10-10 DIAGNOSIS — I10 Essential (primary) hypertension: Secondary | ICD-10-CM | POA: Diagnosis not present

## 2021-10-10 DIAGNOSIS — R7303 Prediabetes: Secondary | ICD-10-CM | POA: Diagnosis not present

## 2021-11-05 DIAGNOSIS — J449 Chronic obstructive pulmonary disease, unspecified: Secondary | ICD-10-CM | POA: Diagnosis not present

## 2021-11-05 DIAGNOSIS — F6381 Intermittent explosive disorder: Secondary | ICD-10-CM | POA: Diagnosis not present

## 2021-11-05 DIAGNOSIS — G4700 Insomnia, unspecified: Secondary | ICD-10-CM | POA: Diagnosis not present

## 2021-12-23 DIAGNOSIS — Z111 Encounter for screening for respiratory tuberculosis: Secondary | ICD-10-CM | POA: Diagnosis not present

## 2021-12-23 DIAGNOSIS — Z23 Encounter for immunization: Secondary | ICD-10-CM | POA: Diagnosis not present

## 2022-02-12 DIAGNOSIS — Z1159 Encounter for screening for other viral diseases: Secondary | ICD-10-CM | POA: Diagnosis not present

## 2022-02-12 DIAGNOSIS — Z23 Encounter for immunization: Secondary | ICD-10-CM | POA: Diagnosis not present

## 2022-04-17 DIAGNOSIS — Z125 Encounter for screening for malignant neoplasm of prostate: Secondary | ICD-10-CM | POA: Diagnosis not present

## 2022-04-17 DIAGNOSIS — I10 Essential (primary) hypertension: Secondary | ICD-10-CM | POA: Diagnosis not present

## 2022-04-17 DIAGNOSIS — K219 Gastro-esophageal reflux disease without esophagitis: Secondary | ICD-10-CM | POA: Diagnosis not present

## 2022-04-17 DIAGNOSIS — R7303 Prediabetes: Secondary | ICD-10-CM | POA: Diagnosis not present

## 2022-04-17 DIAGNOSIS — G4733 Obstructive sleep apnea (adult) (pediatric): Secondary | ICD-10-CM | POA: Diagnosis not present

## 2022-05-08 DIAGNOSIS — H6121 Impacted cerumen, right ear: Secondary | ICD-10-CM | POA: Diagnosis not present

## 2022-05-26 ENCOUNTER — Encounter: Payer: Self-pay | Admitting: Gastroenterology

## 2022-11-19 DIAGNOSIS — I10 Essential (primary) hypertension: Secondary | ICD-10-CM | POA: Diagnosis not present

## 2022-11-19 DIAGNOSIS — R7303 Prediabetes: Secondary | ICD-10-CM | POA: Diagnosis not present

## 2022-11-19 DIAGNOSIS — K219 Gastro-esophageal reflux disease without esophagitis: Secondary | ICD-10-CM | POA: Diagnosis not present

## 2022-11-19 DIAGNOSIS — G4733 Obstructive sleep apnea (adult) (pediatric): Secondary | ICD-10-CM | POA: Diagnosis not present

## 2023-02-11 IMAGING — CT CT CARDIAC CORONARY ARTERY CALCIUM SCORE
3 series · 14 of 20 positions shown, 16 images · non-contrast
Comparison: No priors.
COMPARISON: No priors.

Addendum:
EXAM:
OVER-READ INTERPRETATION  CT CHEST

The following report is an over-read performed by radiologist Dr.
Rudi Jumper [REDACTED] on 09/06/2021. This
over-read does not include interpretation of cardiac or coronary
anatomy or pathology. The coronary calcium score interpretation by
the cardiologist is attached.
CLINICAL DATA: Cardiovascular Disease Risk stratification
Coronary Calcium Score
TECHNIQUE: A gated, non-contrast computed tomography scan of the heart was
performed using 3mm slice thickness. Axial images were analyzed on a
dedicated workstation. Calcium scoring of the coronary arteries was
performed using the Agatston method.

[Series 2: cascseq 2.0 sa36 70% (id) · axial · 0.46mm/px · z∈[-304,-214]mm · 4 of 76 slices shown]
[im 16/76  vessel]
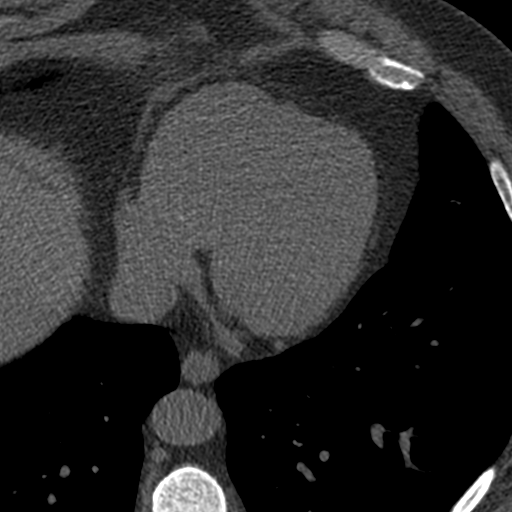
[im 31/76  vessel]
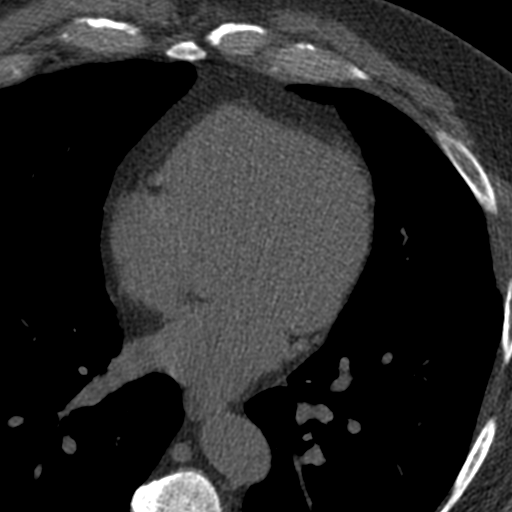
[im 46/76  vessel]
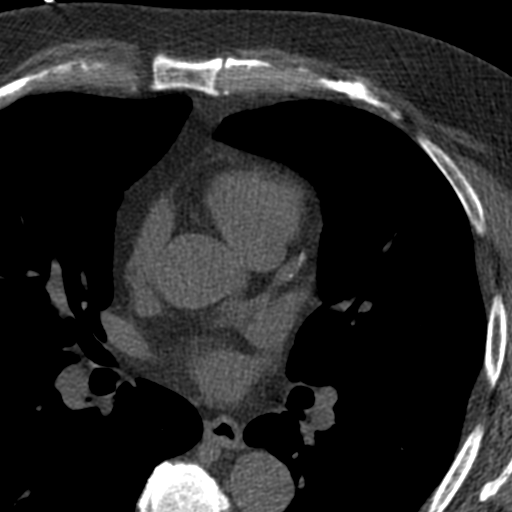
[im 61/76  vessel]
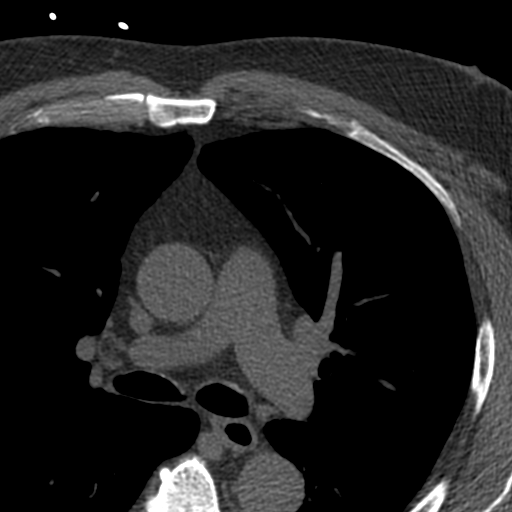

[Series 3: cascseq 2.0 bf37 st · axial · 0.80mm/px · z∈[-310,-210]mm · 5 of 76 slices shown, 7 images]
[im 13/76  vessel]
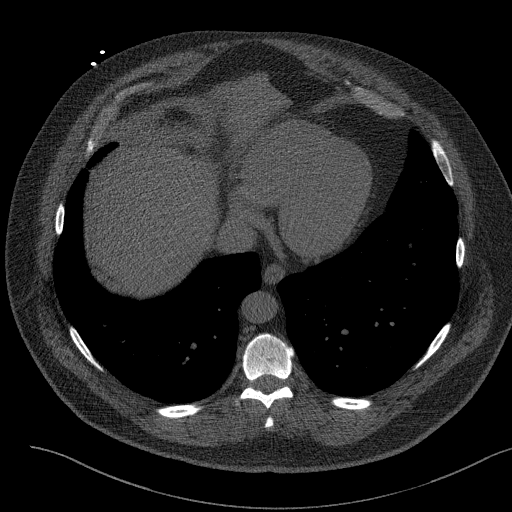
[im 13/76  lung]
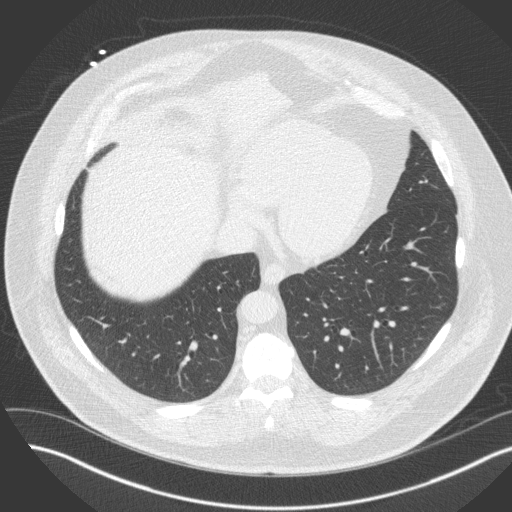
[im 26/76  vessel]
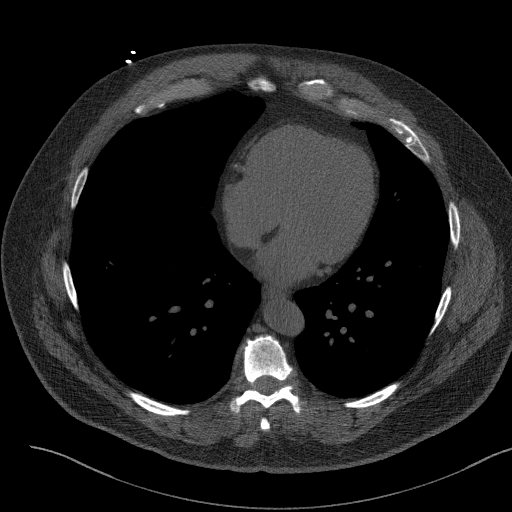
[im 38/76  vessel]
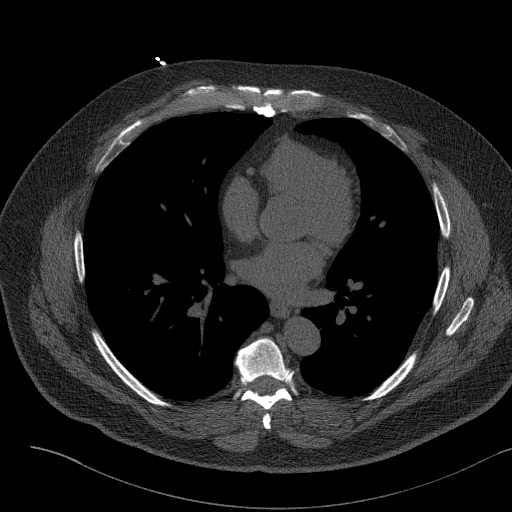
[im 51/76  vessel]
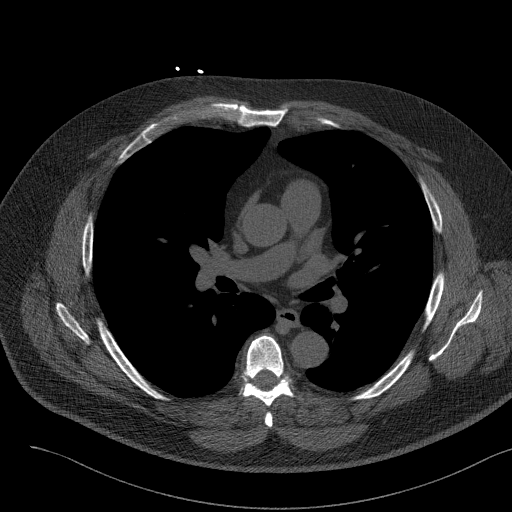
[im 63/76  vessel]
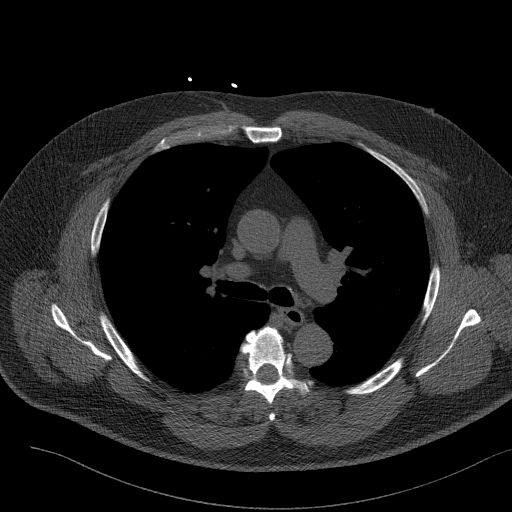
[im 63/76  lung]
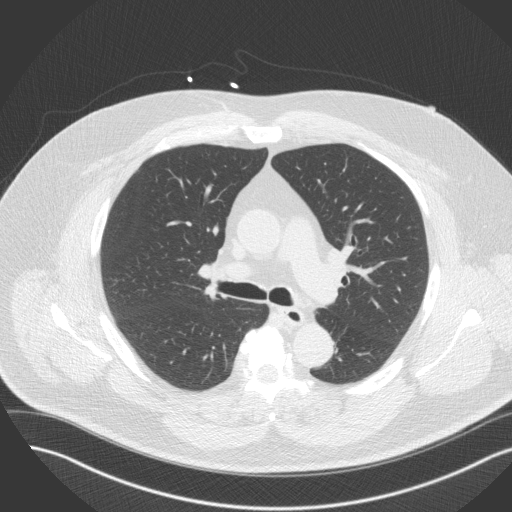

[Series 4: cascseq 2.0 br59 lung · axial · 0.80mm/px · z∈[-310,-210]mm · 5 of 76 slices shown]
[im 13/76  lung]
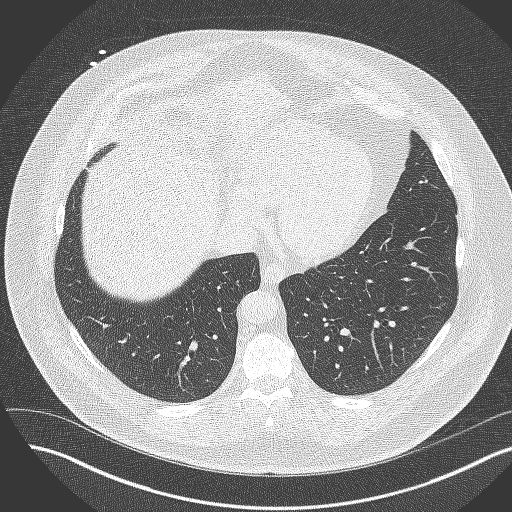
[im 26/76  lung]
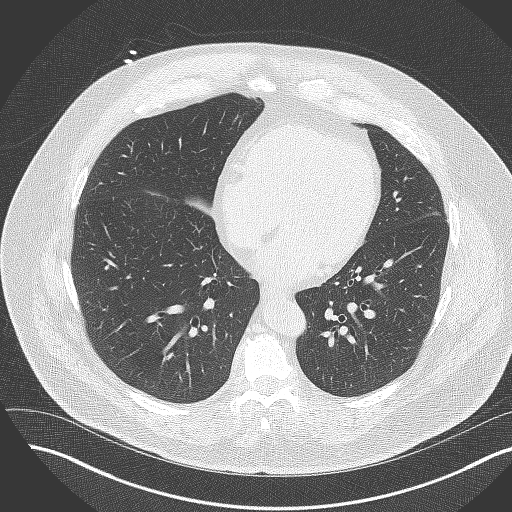
[im 38/76  lung]
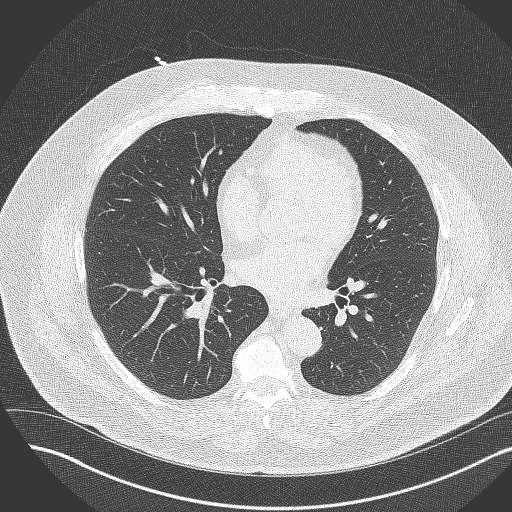
[im 51/76  lung]
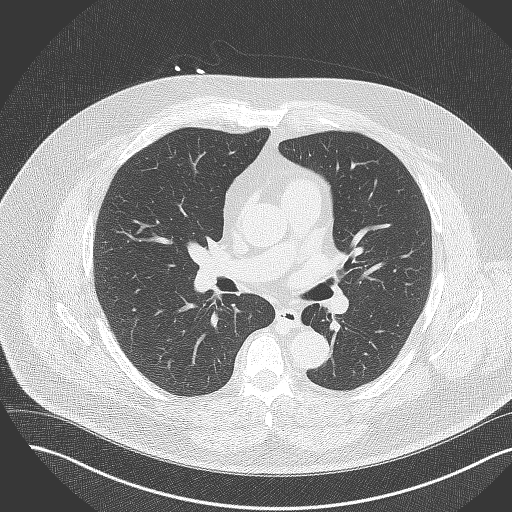
[im 63/76  lung]
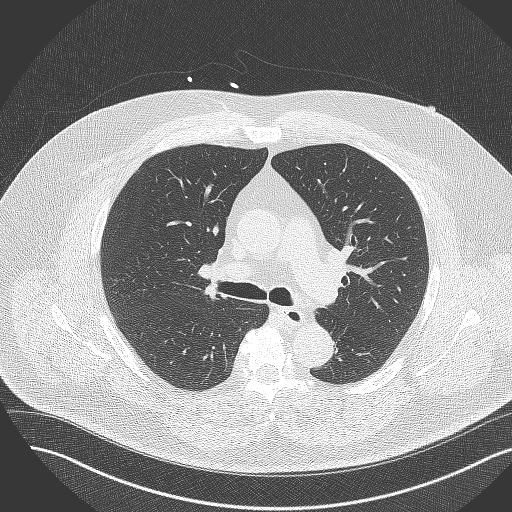

[14 of 20 positions shown; findings below may reference images not displayed]

FINDINGS: Atherosclerotic calcifications in the thoracic aorta. Within the
visualized portions of the thorax there are no suspicious appearing
pulmonary nodules or masses, there is no acute consolidative
airspace disease, no pleural effusions, no pneumothorax and no
lymphadenopathy. Visualized portions of the upper abdomen are
unremarkable. There are no aggressive appearing lytic or blastic
lesions noted in the visualized portions of the skeleton.
IMPRESSION: 1.  Aortic Atherosclerosis ([id number]).
FINDINGS: Coronary arteries: Normal origins.

Coronary Calcium Score:

Left main: 0

Left anterior descending artery:

Left circumflex artery: 0

Right coronary artery:

Total:

Percentile: 49 th

Pericardium: Normal.

Ascending Aorta: Normal caliber.  3.5 cm

Non-cardiac: See separate report from [REDACTED].
IMPRESSION: Coronary calcium score of 17.3. This was 49 th percentile for age-,
race-, and sex-matched controls.



If CAC=0, it is reasonable to withhold statin therapy and reassess
in 5 to 10 years, as long as higher risk conditions are absent
(diabetes mellitus, family history of premature CHD in first degree
relatives (males <55 years; females <65 years), cigarette smoking,
or LDL >=190 mg/dL).

If CAC is 1 to 99, it is reasonable to initiate statin therapy for
patients >=55 years of age.

If CAC is >=100 or >=75th percentile, it is reasonable to initiate
statin therapy at any age.

Cardiology referral should be considered for patients with CAC
scores >=400 or >=75th percentile.

*3865 AHA/ACC/AACVPR/AAPA/ABC/HO TIN/MONTASER/BANGURA/Tiger/JMEAD/MUEMIN/SKILLARENA
Guideline on the Management of Blood Cholesterol: A Report of the
American College of Cardiology/American Heart Association Task Force
on Clinical Practice Guidelines. J Am Coll Cardiol.
0224;73(24):4628-4421.

*** End of Addendum ***
EXAM:
OVER-READ INTERPRETATION  CT CHEST

The following report is an over-read performed by radiologist Dr.
Rudi Jumper [REDACTED] on 09/06/2021. This
over-read does not include interpretation of cardiac or coronary
anatomy or pathology. The coronary calcium score interpretation by
the cardiologist is attached.
FINDINGS: Atherosclerotic calcifications in the thoracic aorta. Within the
visualized portions of the thorax there are no suspicious appearing
pulmonary nodules or masses, there is no acute consolidative
airspace disease, no pleural effusions, no pneumothorax and no
lymphadenopathy. Visualized portions of the upper abdomen are
unremarkable. There are no aggressive appearing lytic or blastic
lesions noted in the visualized portions of the skeleton.
IMPRESSION: 1.  Aortic Atherosclerosis ([id number].Q).

## 2023-05-26 DIAGNOSIS — R350 Frequency of micturition: Secondary | ICD-10-CM | POA: Diagnosis not present

## 2023-05-26 DIAGNOSIS — R7303 Prediabetes: Secondary | ICD-10-CM | POA: Diagnosis not present

## 2023-05-26 DIAGNOSIS — K219 Gastro-esophageal reflux disease without esophagitis: Secondary | ICD-10-CM | POA: Diagnosis not present

## 2023-05-26 DIAGNOSIS — I10 Essential (primary) hypertension: Secondary | ICD-10-CM | POA: Diagnosis not present

## 2023-12-25 DIAGNOSIS — I10 Essential (primary) hypertension: Secondary | ICD-10-CM | POA: Diagnosis not present

## 2023-12-25 DIAGNOSIS — R7303 Prediabetes: Secondary | ICD-10-CM | POA: Diagnosis not present

## 2023-12-25 DIAGNOSIS — B353 Tinea pedis: Secondary | ICD-10-CM | POA: Diagnosis not present

## 2023-12-25 DIAGNOSIS — Z0000 Encounter for general adult medical examination without abnormal findings: Secondary | ICD-10-CM | POA: Diagnosis not present

## 2024-03-04 DIAGNOSIS — G4700 Insomnia, unspecified: Secondary | ICD-10-CM | POA: Diagnosis not present
